# Patient Record
Sex: Female | Born: 1940 | Race: White | Hispanic: No | Marital: Married | Smoking: Never smoker
Health system: Southern US, Community
[De-identification: ages and names within clinical notes are randomized; demographics above are authoritative.]

## PROBLEM LIST (undated history)

## (undated) DIAGNOSIS — B009 Herpesviral infection, unspecified: Secondary | ICD-10-CM

## (undated) DIAGNOSIS — I10 Essential (primary) hypertension: Secondary | ICD-10-CM

## (undated) DIAGNOSIS — M8580 Other specified disorders of bone density and structure, unspecified site: Secondary | ICD-10-CM

## (undated) DIAGNOSIS — R059 Cough, unspecified: Secondary | ICD-10-CM

## (undated) DIAGNOSIS — F419 Anxiety disorder, unspecified: Secondary | ICD-10-CM

## (undated) DIAGNOSIS — K219 Gastro-esophageal reflux disease without esophagitis: Secondary | ICD-10-CM

## (undated) DIAGNOSIS — F32A Depression, unspecified: Secondary | ICD-10-CM

## (undated) DIAGNOSIS — K635 Polyp of colon: Secondary | ICD-10-CM

## (undated) DIAGNOSIS — K2270 Barrett's esophagus without dysplasia: Secondary | ICD-10-CM

## (undated) DIAGNOSIS — F329 Major depressive disorder, single episode, unspecified: Secondary | ICD-10-CM

## (undated) DIAGNOSIS — M549 Dorsalgia, unspecified: Secondary | ICD-10-CM

## (undated) DIAGNOSIS — R05 Cough: Secondary | ICD-10-CM

## (undated) DIAGNOSIS — E7800 Pure hypercholesterolemia, unspecified: Secondary | ICD-10-CM

## (undated) DIAGNOSIS — H9190 Unspecified hearing loss, unspecified ear: Secondary | ICD-10-CM

## (undated) HISTORY — DX: Gastro-esophageal reflux disease without esophagitis: K21.9

## (undated) HISTORY — DX: Cough, unspecified: R05.9

## (undated) HISTORY — PX: ABDOMINAL HYSTERECTOMY: SHX81

## (undated) HISTORY — DX: Barrett's esophagus without dysplasia: K22.70

## (undated) HISTORY — DX: Other specified disorders of bone density and structure, unspecified site: M85.80

## (undated) HISTORY — DX: Depression, unspecified: F32.A

## (undated) HISTORY — DX: Major depressive disorder, single episode, unspecified: F32.9

## (undated) HISTORY — PX: CHOLECYSTECTOMY: SHX55

## (undated) HISTORY — DX: Unspecified hearing loss, unspecified ear: H91.90

## (undated) HISTORY — PX: TUBAL LIGATION: SHX77

## (undated) HISTORY — DX: Anxiety disorder, unspecified: F41.9

## (undated) HISTORY — PX: CARPAL TUNNEL RELEASE: SHX101

## (undated) HISTORY — DX: Herpesviral infection, unspecified: B00.9

## (undated) HISTORY — DX: Cough: R05

## (undated) HISTORY — DX: Polyp of colon: K63.5

---

## 2009-11-21 HISTORY — PX: CARDIOVASCULAR STRESS TEST: SHX262

## 2012-05-18 LAB — HM MAMMOGRAPHY

## 2013-01-26 ENCOUNTER — Encounter: Payer: Self-pay | Admitting: *Deleted

## 2013-01-26 ENCOUNTER — Other Ambulatory Visit: Payer: Self-pay | Admitting: *Deleted

## 2013-01-27 MED ORDER — TIZANIDINE HCL 4 MG PO TABS: 4.0000 mg | ORAL_TABLET | Freq: Every evening | ORAL | Status: DC | PRN

## 2013-01-27 MED ORDER — OMEPRAZOLE 20 MG PO CPDR: 20.0000 mg | DELAYED_RELEASE_CAPSULE | Freq: Two times a day (BID) | ORAL | Status: DC

## 2013-01-27 MED ORDER — SERTRALINE HCL 50 MG PO TABS: ORAL_TABLET | ORAL | Status: DC

## 2013-03-17 ENCOUNTER — Encounter: Payer: Self-pay | Admitting: Family Medicine

## 2013-03-17 ENCOUNTER — Ambulatory Visit (INDEPENDENT_AMBULATORY_CARE_PROVIDER_SITE_OTHER): Payer: Medicare Other | Admitting: Family Medicine

## 2013-03-17 VITALS — BP 172/76 | HR 57 | Ht 64.5 in | Wt 162.0 lb

## 2013-03-17 DIAGNOSIS — K219 Gastro-esophageal reflux disease without esophagitis: Secondary | ICD-10-CM

## 2013-03-17 DIAGNOSIS — G4700 Insomnia, unspecified: Secondary | ICD-10-CM

## 2013-03-17 DIAGNOSIS — M79609 Pain in unspecified limb: Secondary | ICD-10-CM

## 2013-03-17 DIAGNOSIS — M79644 Pain in right finger(s): Secondary | ICD-10-CM

## 2013-03-17 DIAGNOSIS — F411 Generalized anxiety disorder: Secondary | ICD-10-CM

## 2013-03-17 MED ORDER — SERTRALINE HCL 50 MG PO TABS: ORAL_TABLET | ORAL | Status: DC

## 2013-03-17 MED ORDER — DICLOFENAC SODIUM 1 % TD GEL: TRANSDERMAL | Status: DC

## 2013-03-17 MED ORDER — TIZANIDINE HCL 4 MG PO TABS: 4.0000 mg | ORAL_TABLET | Freq: Every evening | ORAL | Status: DC | PRN

## 2013-03-17 MED ORDER — OMEPRAZOLE 20 MG PO CPDR: 20.0000 mg | DELAYED_RELEASE_CAPSULE | Freq: Two times a day (BID) | ORAL | Status: AC

## 2013-03-17 NOTE — Patient Instructions (Addendum)
1)  Hand Pain - Try the Voltaren Gel up to 4 times per day plus Tylenol 1000 mg up to 3 times vs Ibuprofen 600 mg + Tylenol 1000 mg.  Try the Fish Oil 1-3 teaspoons per day.   Osteoarthritis Osteoarthritis is the most common form of arthritis. It is redness, soreness, and swelling (inflammation) affecting the cartilage. Cartilage acts as a cushion, covering the ends of bones where they meet to form a joint. CAUSES  Over time, the cartilage begins to wear away. This causes bone to rub on bone. This produces pain and stiffness in the affected joints. Factors that contribute to this problem are:  Excessive body weight.  Age.  Overuse of joints. SYMPTOMS   People with osteoarthritis usually experience joint pain, swelling, or stiffness.  Over time, the joint may lose its normal shape.  Small deposits of bone (osteophytes) may grow on the edges of the joint.  Bits of bone or cartilage can break off and float inside the joint space. This may cause more pain and damage.  Osteoarthritis can lead to depression, anxiety, feelings of helplessness, and limitations on daily activities. The most commonly affected joints are in the:  Ends of the fingers.  Thumbs.  Neck.  Lower back.  Knees.  Hips. DIAGNOSIS  Diagnosis is mostly based on your symptoms and exam. Tests may be helpful, including:  X-rays of the affected joint.  A computerized magnetic scan (MRI).  Blood tests to rule out other types of arthritis.  Joint fluid tests. This involves using a needle to draw fluid from the joint and examining the fluid under a microscope. TREATMENT  Goals of treatment are to control pain, improve joint function, maintain a normal body weight, and maintain a healthy lifestyle. Treatment approaches may include:  A prescribed exercise program with rest and joint relief.  Weight control with nutritional education.  Pain relief techniques such as:  Properly applied heat and cold.  Electric  pulses delivered to nerve endings under the skin (transcutaneous electrical nerve stimulation, TENS).  Massage.  Certain supplements. Ask your caregiver before using any supplements, especially in combination with prescribed drugs.  Medicines to control pain, such as:  Acetaminophen.  Nonsteroidal anti-inflammatory drugs (NSAIDs), such as naproxen.  Narcotic or central-acting agents, such as tramadol. This drug carries a risk of addiction and is generally prescribed for short-term use.  Corticosteroids. These can be given orally or as injection. This is a short-term treatment, not recommended for routine use.  Surgery to reposition the bones and relieve pain (osteotomy) or to remove loose pieces of bone and cartilage. Joint replacement may be needed in advanced states of osteoarthritis. HOME CARE INSTRUCTIONS  Your caregiver can recommend specific types of exercise. These may include:  Strengthening exercises. These are done to strengthen the muscles that support joints affected by arthritis. They can be performed with weights or with exercise bands to add resistance.  Aerobic activities. These are exercises, such as brisk walking or low-impact aerobics, that get your heart pumping. They can help keep your lungs and circulatory system in shape.  Range-of-motion activities. These keep your joints limber.  Balance and agility exercises. These help you maintain daily living skills. Learning about your condition and being actively involved in your care will help improve the course of your osteoarthritis. SEEK MEDICAL CARE IF:   You feel hot or your skin turns red.  You develop a rash in addition to your joint pain.  You have an oral temperature above 102  F (38.9 C). FOR MORE INFORMATION  National Institute of Arthritis and Musculoskeletal and Skin Diseases: www.niams.http://www.myers.net/ [employer] on Aging: https://walker.com/ American College of Rheumatology:  www.rheumatology.org Document Released: 10/21/2005 Document Revised: 01/13/2012 Document Reviewed: 02/01/2010 [facility] Patient Information 2013 [place], [place].

## 2013-03-17 NOTE — Progress Notes (Signed)
  Subjective:    Patient ID: Lindsey Bender, female    DOB: 05-Sep-1941, 72 y.o.   MRN: [id number]  HPI Lindsey Bender is here today to discuss a few issues:    1)  Finger Pain:  She has been having pain and stiffness in her 4th Right Finger for the past 2 weeks.  She describes the pain as being severe and she feels that it is worsening.     2)  Anxiety:  She has done well with her Zoloft and needs a refill on it.    3)  GERD:  She continues to take her omeprazole twice daily and needs a refill on it.    4)  Insomnia:  She also needs to have her Zanaflex refilled.     Review of Systems  Constitutional: Negative for activity change, fatigue and unexpected weight change.  HENT: Negative.   Eyes: Negative.   Respiratory: Negative for shortness of breath.   Cardiovascular: Negative for chest pain, palpitations and leg swelling.  Gastrointestinal: Negative for diarrhea and constipation.  Endocrine: Negative.   Genitourinary: Negative for difficulty urinating.  Musculoskeletal: Positive for joint swelling and arthralgias.  Skin: Negative.   Neurological: Negative.   Hematological: Negative for adenopathy. Does not bruise/bleed easily.  Psychiatric/Behavioral: Negative for sleep disturbance and dysphoric mood. The patient is not nervous/anxious.        Objective:   Physical Exam  Constitutional: She appears well-nourished. No distress.  HENT:  Head: Normocephalic.  Eyes: No scleral icterus.  Neck: No thyromegaly present.  Cardiovascular: Normal rate, regular rhythm and normal heart sounds.   Pulmonary/Chest: Effort normal and breath sounds normal.  Abdominal: There is no tenderness.  Musculoskeletal: She exhibits no edema and no tenderness.       Right hand: She exhibits tenderness and swelling.  Neurological: She is alert.  Skin: Skin is warm and dry.  Psychiatric: She has a normal mood and affect. Her behavior is normal. Judgment and thought content normal.          Assessment &  Plan:

## 2013-03-29 ENCOUNTER — Encounter: Payer: Self-pay | Admitting: Family Medicine

## 2013-03-29 DIAGNOSIS — K219 Gastro-esophageal reflux disease without esophagitis: Secondary | ICD-10-CM | POA: Insufficient documentation

## 2013-03-29 DIAGNOSIS — G4700 Insomnia, unspecified: Secondary | ICD-10-CM | POA: Insufficient documentation

## 2013-03-29 DIAGNOSIS — M79644 Pain in right finger(s): Secondary | ICD-10-CM | POA: Insufficient documentation

## 2013-03-29 DIAGNOSIS — F411 Generalized anxiety disorder: Secondary | ICD-10-CM | POA: Insufficient documentation

## 2013-03-29 NOTE — Assessment & Plan Note (Signed)
Refilled her Zoloft  

## 2013-03-29 NOTE — Assessment & Plan Note (Signed)
Refilled her Zanaflex.

## 2013-03-29 NOTE — Assessment & Plan Note (Signed)
Refilled her omeprazole.  

## 2013-03-29 NOTE — Assessment & Plan Note (Signed)
She was given a prescription for Voltaren Gel and is to take some fish oil to help with inflammation.

## 2013-04-21 ENCOUNTER — Ambulatory Visit (INDEPENDENT_AMBULATORY_CARE_PROVIDER_SITE_OTHER): Payer: Medicare Other | Admitting: Family Medicine

## 2013-04-21 ENCOUNTER — Encounter: Payer: Self-pay | Admitting: Family Medicine

## 2013-04-21 VITALS — BP 122/70 | HR 60 | Ht 64.5 in | Wt 164.0 lb

## 2013-04-21 DIAGNOSIS — I10 Essential (primary) hypertension: Secondary | ICD-10-CM

## 2013-04-21 DIAGNOSIS — M7989 Other specified soft tissue disorders: Secondary | ICD-10-CM

## 2013-04-21 MED ORDER — FUROSEMIDE 20 MG PO TABS: ORAL_TABLET | ORAL | Status: DC

## 2013-04-21 NOTE — Assessment & Plan Note (Signed)
We discussed reasons why she might be having some edema.  The only thing that is different is that she has been using something called Blue Emu.  She is to hold on this and will watch her intake of sodium.  If her swelling does not improve, we may send for a venous doppler study.  She was given a prescription for Lasix.

## 2013-04-21 NOTE — Assessment & Plan Note (Signed)
Her BP is much better than it was at her last visit.

## 2013-04-21 NOTE — Patient Instructions (Addendum)
1)  Edema - Drink a lot of water with some lemon juice.  Decrease the EMU cream and see if the swelling improves.  If needed, you can try some of the Lasix.  Exercise and limit intake of sodium.    Edema Edema is an abnormal build-up of fluids in tissues. Because this is partly dependent on gravity (water flows to the lowest place), it is more common in the legs and thighs (lower extremities). It is also common in the looser tissues, like around the eyes. Painless swelling of the feet and ankles is common and increases as a person ages. It may affect both legs and may include the calves or even thighs. When squeezed, the fluid may move out of the affected area and may leave a dent for a few moments. CAUSES   Prolonged standing or sitting in one place for extended periods of time. Movement helps pump tissue fluid into the veins, and absence of movement prevents this, resulting in edema.  Varicose veins. The valves in the veins do not work as well as they should. This causes fluid to leak into the tissues.  Fluid and salt overload.  Injury, burn, or surgery to the leg, ankle, or foot, may damage veins and allow fluid to leak out.  Sunburn damages vessels. Leaky vessels allow fluid to go out into the sunburned tissues.  Allergies (from insect bites or stings, medications or chemicals) cause swelling by allowing vessels to become leaky.  Protein in the blood helps keep fluid in your vessels. Low protein, as in malnutrition, allows fluid to leak out.  Hormonal changes, including pregnancy and menstruation, cause fluid retention. This fluid may leak out of vessels and cause edema.  Medications that cause fluid retention. Examples are sex hormones, blood pressure medications, steroid treatment, or anti-depressants.  Some illnesses cause edema, especially heart failure, kidney disease, or liver disease.  Surgery that cuts veins or lymph nodes, such as surgery done for the heart or for breast  cancer, may result in edema. DIAGNOSIS  Your caregiver is usually easily able to determine what is causing your swelling (edema) by simply asking what is wrong (getting a history) and examining you (doing a physical). Sometimes x-rays, EKG (electrocardiogram or heart tracing), and blood work may be done to evaluate for underlying medical illness. TREATMENT  General treatment includes:  Leg elevation (or elevation of the affected body part).  Restriction of fluid intake.  Prevention of fluid overload.  Compression of the affected body part. Compression with elastic bandages or support stockings squeezes the tissues, preventing fluid from entering and forcing it back into the blood vessels.  Diuretics (also called water pills or fluid pills) pull fluid out of your body in the form of increased urination. These are effective in reducing the swelling, but can have side effects and must be used only under your caregiver's supervision. Diuretics are appropriate only for some types of edema. The specific treatment can be directed at any underlying causes discovered. Heart, liver, or kidney disease should be treated appropriately. HOME CARE INSTRUCTIONS   Elevate the legs (or affected body part) above the level of the heart, while lying down.  Avoid sitting or standing still for prolonged periods of time.  Avoid putting anything directly under the knees when lying down, and do not wear constricting clothing or garters on the upper legs.  Exercising the legs causes the fluid to work back into the veins and lymphatic channels. This may help the swelling go down.  The pressure applied by elastic bandages or support stockings can help reduce ankle swelling.  A low-salt diet may help reduce fluid retention and decrease the ankle swelling.  Take any medications exactly as prescribed. SEEK MEDICAL CARE IF:  Your edema is not responding to recommended treatments. SEEK IMMEDIATE MEDICAL CARE IF:    You develop shortness of breath or chest pain.  You cannot breathe when you lay down; or if, while lying down, you have to get up and go to the window to get your breath.  You are having increasing swelling without relief from treatment.  You develop a fever over 102 F (38.9 C).  You develop pain or redness in the areas that are swollen.  Tell your caregiver right away if you have gained 3 lb/1.4 kg in 1 day or 5 lb/2.3 kg in a week. MAKE SURE YOU:   Understand these instructions.  Will watch your condition.  Will get help right away if you are not doing well or get worse. Document Released: 10/21/2005 Document Revised: 04/21/2012 Document Reviewed: 06/08/2008 [facility] Patient Information 2014 [place], [place].

## 2013-04-21 NOTE — Progress Notes (Signed)
  Subjective:    Patient ID: Lindsey Bender, female    DOB: 1940/12/03, 72 y.o.   MRN: [id number]  HPI  Lindsey Bender is here today complaining of swelling in both her feet.   She noticed this problem stared about three weeks ago. This problem seems to be the same throughout the day.    Review of Systems  Respiratory: Negative for shortness of breath.   Musculoskeletal: Negative for myalgias and arthralgias.       Bilateral feet swelling  Skin: Negative for rash.    Past Medical History  Diagnosis Date  . GERD (gastroesophageal reflux disease)   . Anxiety   . Depression   . Osteopenia   . Colon polyp   . Hearing loss     Hearing Aides x's 2  . Cough   . 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 Heart disease Father     CVD (bypass x's 4)  . Alzheimer's disease Father   . Heart disease Mother 34    Rheumatic heart disease  . Heart attack Brother     MI x's 3--Stents  . Heart disease Paternal Uncle    History   Social History Narrative   Marital Status: Married Lindsey Bender) x 53 years   Children: 3    Pets:  Dogs (2)    Living Situation: Lives with husband   Occupation: Retired Agricultural consultant)    Education: 9 th grade   Tobacco Use/Exposure:  None    Alcohol Use:  None   Drug Use:  None   Diet:  Regular   Exercise:  None   Hobbies: Reading        Objective:   Physical Exam  Constitutional: She appears well-nourished. No distress.  Cardiovascular: Normal rate, regular rhythm and normal heart sounds.   Pulmonary/Chest: Effort normal and breath sounds normal. No respiratory distress.  Musculoskeletal: She exhibits edema (Mild edema ).  Skin: No erythema.          Assessment & Plan:

## 2013-06-18 LAB — HM MAMMOGRAPHY

## 2013-09-23 ENCOUNTER — Other Ambulatory Visit: Payer: Self-pay | Admitting: *Deleted

## 2013-09-23 DIAGNOSIS — R5381 Other malaise: Secondary | ICD-10-CM

## 2013-09-23 DIAGNOSIS — E785 Hyperlipidemia, unspecified: Secondary | ICD-10-CM

## 2013-09-24 ENCOUNTER — Other Ambulatory Visit: Payer: Medicare Other

## 2013-09-24 LAB — COMPLETE METABOLIC PANEL WITH GFR
ALT: 17 U/L (ref 0–35)
AST: 20 U/L (ref 0–37)
Albumin: 3.8 g/dL (ref 3.5–5.2)
Alkaline Phosphatase: 81 U/L (ref 39–117)
BUN: 13 mg/dL (ref 6–23)
CO2: 29 mEq/L (ref 19–32)
Calcium: 9.1 mg/dL (ref 8.4–10.5)
Chloride: 102 mEq/L (ref 96–112)
Creat: 0.74 mg/dL (ref 0.50–1.10)
GFR, Est African American: >89 mL/min
GFR, Est Non African American: 82 mL/min
Glucose, Bld: 88 mg/dL (ref 70–99)
Potassium: 4.5 mEq/L (ref 3.5–5.3)
Sodium: 140 mEq/L (ref 135–145)
Total Bilirubin: 0.6 mg/dL (ref 0.3–1.2)
Total Protein: 6.4 g/dL (ref 6.0–8.3)

## 2013-09-24 LAB — CBC WITH DIFFERENTIAL/PLATELET
Basophils Absolute: 0 10*3/uL (ref 0.0–0.1)
Basophils Relative: 1 % (ref 0–1)
Eosinophils Absolute: 0.1 10*3/uL (ref 0.0–0.7)
Eosinophils Relative: 2 % (ref 0–5)
HCT: 39.2 % (ref 36.0–46.0)
Hemoglobin: 13.6 g/dL (ref 12.0–15.0)
Lymphocytes Relative: 32 % (ref 12–46)
Lymphs Abs: 1.9 10*3/uL (ref 0.7–4.0)
MCH: 31.9 pg (ref 26.0–34.0)
MCHC: 34.7 g/dL (ref 30.0–36.0)
MCV: 91.8 fL (ref 78.0–100.0)
Monocytes Absolute: 0.6 10*3/uL (ref 0.1–1.0)
Monocytes Relative: 10 % (ref 3–12)
Neutro Abs: 3.2 10*3/uL (ref 1.7–7.7)
Neutrophils Relative %: 55 % (ref 43–77)
Platelets: 326 10*3/uL (ref 150–400)
RBC: 4.27 MIL/uL (ref 3.87–5.11)
RDW: 14.4 % (ref 11.5–15.5)
WBC: 5.8 10*3/uL (ref 4.0–10.5)

## 2013-09-24 LAB — TSH: TSH: 3.213 u[IU]/mL (ref 0.350–4.500)

## 2013-09-24 LAB — LIPID PANEL
Cholesterol: 238 mg/dL — ABNORMAL HIGH (ref 0–200)
HDL: 66 mg/dL (ref >39)
LDL Cholesterol: 158 mg/dL — ABNORMAL HIGH (ref 0–99)
Total CHOL/HDL Ratio: 3.6 Ratio
Triglycerides: 69 mg/dL (ref <150)
VLDL: 14 mg/dL (ref 0–40)

## 2013-09-27 ENCOUNTER — Ambulatory Visit (INDEPENDENT_AMBULATORY_CARE_PROVIDER_SITE_OTHER): Payer: Medicare Other | Admitting: Family Medicine

## 2013-09-27 ENCOUNTER — Encounter: Payer: Self-pay | Admitting: Family Medicine

## 2013-09-27 VITALS — BP 133/78 | HR 58 | Resp 16 | Wt 158.0 lb

## 2013-09-27 DIAGNOSIS — F329 Major depressive disorder, single episode, unspecified: Secondary | ICD-10-CM | POA: Insufficient documentation

## 2013-09-27 DIAGNOSIS — K219 Gastro-esophageal reflux disease without esophagitis: Secondary | ICD-10-CM

## 2013-09-27 DIAGNOSIS — IMO0001 Reserved for inherently not codable concepts without codable children: Secondary | ICD-10-CM | POA: Insufficient documentation

## 2013-09-27 DIAGNOSIS — F411 Generalized anxiety disorder: Secondary | ICD-10-CM

## 2013-09-27 DIAGNOSIS — G4700 Insomnia, unspecified: Secondary | ICD-10-CM

## 2013-09-27 DIAGNOSIS — F3289 Other specified depressive episodes: Secondary | ICD-10-CM | POA: Insufficient documentation

## 2013-09-27 MED ORDER — SERTRALINE HCL 50 MG PO TABS: 50.0000 mg | ORAL_TABLET | Freq: Every day | ORAL | Status: DC

## 2013-09-27 MED ORDER — TIZANIDINE HCL 4 MG PO TABS: 4.0000 mg | ORAL_TABLET | Freq: Every day | ORAL | Status: DC

## 2013-09-27 NOTE — Assessment & Plan Note (Signed)
Refilled her Tizanidine.

## 2013-09-27 NOTE — Assessment & Plan Note (Signed)
Refilled her Zoloft  

## 2013-09-27 NOTE — Progress Notes (Signed)
Subjective:    Patient ID: Lindsey Bender, female    DOB: 01-Mar-1941, 72 y.o.   MRN: [id number]  HPI  Lindsey Bender is here today to go over her most recent lab results and to get her medications refilled.   1)  Mood:  She is taking her Zoloft (50 mg daily).  She used to take only 25 mg but this has been a stressful year and she feels that the 50 mg has helped her cope better with the stress.      2)  GERD:  She takes Omeprazole 20 mg (twice daily).  This dosage was increased in 12/2010 after her last EGD showed that she had Barrett's Esophagus.  She had this EGD with Dr. Claudine Mouton.  She thought she was to have a repeat in one year but that was not set up.  She would like to be referred to a gastroenterologist to follow this condition.  She is also going to be due for her colonoscopy in 04-12-14.  Her last one was in 04/12/2009 and she has a history of polyps.    3)  Sleep Disturbance:  She needs her tizanidine refilled.    Review of Systems  Constitutional: Negative.   HENT: Negative.   Eyes: Negative.   Respiratory: Negative.   Cardiovascular: Negative.   Gastrointestinal: Negative.   Endocrine: Negative.   Genitourinary: Negative.   Musculoskeletal: Negative.   Skin: Negative.   Allergic/Immunologic: Negative.   Neurological: Negative.   Hematological: Negative.   Psychiatric/Behavioral: Positive for dysphoric mood. The patient is nervous/anxious.        Increased stress in 2013/04/12 (Death of Father, Moving, Work)      Past Medical History  Diagnosis Date  . GERD (gastroesophageal reflux disease)   . Anxiety   . Depression   . Osteopenia   . Colon polyp   . Hearing loss     Hearing Aids x 2  . Cough   . 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 Cholecystectomy    . Tubal ligation    . Abdominal hysterectomy      Fibroid Tumors  . Cardiovascular stress test  11/21/09    it was done due to an abnormal EKG. Her stress test was WNL      Family History  Problem Relation Age of Onset  . Heart disease Father     CVD (bypass x's 4)  . Alzheimer's disease Father   . Heart disease Mother 7    Rheumatic heart disease  . Heart attack Brother     MI x's 3--Stents  . Heart disease Paternal Uncle      History   Social History Narrative   Marital Status: Married Merlyn Albert) x 53 years   Children: 3    Pets:  Dogs (2)    Living Situation: Lives with husband   Occupation: Retired Agricultural consultant)    Education: 9 th grade   Tobacco Use/Exposure:  None    Alcohol Use:  None   Drug Use:  None   Diet:  Regular   Exercise:  None   Hobbies: Reading       Objective:   Physical Exam  Constitutional: She appears well-nourished. No distress.  HENT:  Head: Normocephalic.  Eyes: No scleral icterus.  Neck: No thyromegaly present.  Cardiovascular: Normal rate, regular rhythm and normal heart sounds.   Pulmonary/Chest: Effort normal and breath sounds normal.  Abdominal: There is no  tenderness.  Musculoskeletal: She exhibits no edema and no tenderness.  Neurological: She is alert.  Skin: Skin is warm and dry.  Psychiatric: She has a normal mood and affect. Her behavior is normal. Judgment and thought content normal.          Assessment & Plan:

## 2013-09-27 NOTE — Assessment & Plan Note (Signed)
She was noted to have Barrett's Esophagus at her EGD in 2012.  She is in need of another one.  We'll refer her to a gastroenterologist.

## 2013-09-27 NOTE — Patient Instructions (Addendum)
1)   Mood - Continue on the 50 mg of Zoloft through the holidays before you consider cutting back to 25 mg.  YOGA might help with mood/anxiety if you feel that you need more help and don't want to increase your medication.   2)  GI - You are past due for an EGD and you'll need a colonoscopy next year so we're getting you set up with a gastroenterologist.  Stay on the omeprazole 2 x per day until you see the gastroenterologist and get their opinion about what you should be on.    3)  Insomnia - Continue on the tizanidine at bedtime.                              Barrett's Esophagus Barrett's esophagus occurs when the lining of the esophagus is damaged. The esophagus is the tube that carries food from the mouth to the stomach. With Barrett's esophagus, the lining of the esophagus gets replaced by material that is similar to the lining in the intestines. This process is called intestinal metaplasia. A small number of people with Barrett's esophagus develop esophageal cancer. CAUSES  The exact cause of Barrett's esophagus is unknown. SYMPTOMS  Most people with Barrett's esophagus do not have symptoms. However, many patients also have gastroesophageal reflux disease (GERD). GERD can cause heartburn, trouble swallowing, and a dry cough. DIAGNOSIS Barrett's esophagus is diagnosed by an exam called upper gastrointestinal endoscopy. A thin, flexible tube (endoscope) is passed down the esophagus. The endoscope has a light and camera on the end. Your caregiver uses the endoscope to view the inside of the esophagus. A tissue sample may also be taken and examined under a microscope (biopsy). If cancer cells are found during the biopsy, this condition is called dysplasia. TREATMENT  If you have no dysplasia or low-grade dysplasia, your caregiver may recommend no treatment or only taking medicines to treat GERD. Sometimes, taking acid-blocking drugs to treat GERD helps improve the tissue affected by Barrett's  esophagus. Your caregiver may also recommend periodic esophageal exams. If you have high-grade dysplasia, treatment may include removing the damaged parts of the esophagus. This can be done by heating, freezing, or surgically removing the tissue. In some cases, surgery may be done to remove most of the esophagus. The stomach is then attached to the remaining portion of the esophagus. HOME CARE INSTRUCTIONS  Take acid-blocking drugs for GERD if recommended by your caregiver.  Keep all follow-up appointments as directed by your caregiver. You may need periodic esophageal exams. SEEK IMMEDIATE MEDICAL CARE IF:  You have chest pain.  You have trouble swallowing.  You vomit blood or material that looks like coffee grounds.  Your stools are bright red or dark. Document Released: 01/11/2004 Document Revised: 04/21/2012 Document Reviewed: 12/31/2011 [facility] Patient Information 2014 [place], [place].

## 2013-10-07 ENCOUNTER — Encounter: Payer: Self-pay | Admitting: *Deleted

## 2013-10-20 ENCOUNTER — Telehealth: Payer: Self-pay | Admitting: *Deleted

## 2013-10-20 NOTE — Telephone Encounter (Signed)
She is aware of her appointment with Dr Norma Fredrickson on 11/30/12 @ 8:30 at the [facility] office.

## 2013-11-04 DIAGNOSIS — K2270 Barrett's esophagus without dysplasia: Secondary | ICD-10-CM

## 2013-11-04 HISTORY — DX: Barrett's esophagus without dysplasia: K22.70

## 2014-01-10 ENCOUNTER — Ambulatory Visit (INDEPENDENT_AMBULATORY_CARE_PROVIDER_SITE_OTHER): Payer: 59 | Admitting: Family Medicine

## 2014-01-10 ENCOUNTER — Encounter: Payer: Self-pay | Admitting: Family Medicine

## 2014-01-10 ENCOUNTER — Encounter (INDEPENDENT_AMBULATORY_CARE_PROVIDER_SITE_OTHER): Payer: Self-pay

## 2014-01-10 VITALS — BP 146/70 | HR 78 | Resp 16 | Ht 64.5 in | Wt 160.0 lb

## 2014-01-10 DIAGNOSIS — B009 Herpesviral infection, unspecified: Secondary | ICD-10-CM

## 2014-01-10 DIAGNOSIS — IMO0001 Reserved for inherently not codable concepts without codable children: Secondary | ICD-10-CM

## 2014-01-10 DIAGNOSIS — Z23 Encounter for immunization: Secondary | ICD-10-CM

## 2014-01-10 DIAGNOSIS — F329 Major depressive disorder, single episode, unspecified: Secondary | ICD-10-CM

## 2014-01-10 DIAGNOSIS — G4700 Insomnia, unspecified: Secondary | ICD-10-CM

## 2014-01-10 DIAGNOSIS — F3289 Other specified depressive episodes: Secondary | ICD-10-CM

## 2014-01-10 DIAGNOSIS — Z0000 Encounter for general adult medical examination without abnormal findings: Secondary | ICD-10-CM

## 2014-01-10 LAB — POCT URINALYSIS DIPSTICK
Bilirubin, UA: NEGATIVE
Blood, UA: NEGATIVE
Glucose, UA: NEGATIVE
Ketones, UA: NEGATIVE
Leukocytes, UA: NEGATIVE
Nitrite, UA: NEGATIVE
Protein, UA: NEGATIVE
Spec Grav, UA: 1.015
Urobilinogen, UA: NEGATIVE
pH, UA: 6

## 2014-01-10 MED ORDER — TIZANIDINE HCL 4 MG PO TABS: 4.0000 mg | ORAL_TABLET | Freq: Every day | ORAL | Status: AC

## 2014-01-10 MED ORDER — FAMCICLOVIR 500 MG PO TABS: ORAL_TABLET | ORAL | Status: AC

## 2014-01-10 MED ORDER — SERTRALINE HCL 50 MG PO TABS: 50.0000 mg | ORAL_TABLET | Freq: Every day | ORAL | Status: AC

## 2014-01-10 NOTE — Progress Notes (Signed)
Subjective:    Patient ID: Lindsey Bender, female    DOB: 09/19/1941, 73 y.o.   MRN: [id number]  HPI  Lindsey Bender is here today for her annual CPE.  She has done well since her last office visit.  She needs her Zoloft and Tizanidine refilled.    Review of Systems  Constitutional: Negative for activity change, appetite change, fatigue and unexpected weight change.  HENT: Negative for congestion, dental problem, ear pain, hearing loss, trouble swallowing and voice change.   Eyes: Negative for pain, redness and visual disturbance.  Respiratory: Negative for cough and shortness of breath.   Cardiovascular: Negative for chest pain, palpitations and leg swelling.  Gastrointestinal: Negative for nausea, vomiting, abdominal pain, diarrhea, constipation and blood in stool.  Endocrine: Negative for cold intolerance, heat intolerance, polydipsia, polyphagia and polyuria.  Genitourinary: Negative for dysuria, urgency, frequency, hematuria, vaginal discharge and pelvic pain.  Musculoskeletal: Negative for arthralgias, back pain, joint swelling, myalgias and neck pain.  Skin: Negative for rash.  Neurological: Negative for dizziness, weakness and headaches.  Hematological: Negative for adenopathy. Does not bruise/bleed easily.  Psychiatric/Behavioral: Negative for sleep disturbance, dysphoric mood and decreased concentration. The patient is not nervous/anxious.     Past Medical History  Diagnosis Date  . GERD (gastroesophageal reflux disease)   . Anxiety   . Depression   . Osteopenia   . Colon polyp   . Hearing loss     Hearing Aids x 2  . Cough   . 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 Barrett esophagus 11/2013    EGD (Dr. Norma Fredricksonoledo) Repeat in 3 years.     Past Surgical History  Procedure Laterality Date  . Cholecystectomy    . Tubal ligation    . Abdominal hysterectomy      Fibroid Tumors  . Cardiovascular stress test  11/21/09    it was done due to an abnormal EKG. 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 Heart disease Father     CVD (bypass x's 4)  . Alzheimer's disease Father   . Heart disease Mother [id number]    Rheumatic heart disease  . Heart attack Brother     MI x's 3--Stents  . Heart disease Paternal Uncle      Current Outpatient Prescriptions on File Prior to Visit  Medication Sig Dispense Refill  . Calcium Citrate-Vitamin D (CITRACAL + D PO) Take 2 tablets by mouth.      Marland Kitchen. omeprazole (PRILOSEC) 20 MG capsule Take 1 capsule (20 mg total) by mouth 2 (two) times daily.  60 capsule  11   No current facility-administered medications on file prior to visit.     Allergies  Allergen Reactions  . Codeine Nausea And Vomiting  . Tetracyclines & Related Nausea And Vomiting     Immunization History  Administered Date(s) Administered  . Pneumococcal Conjugate-13 01/10/2014  . Td 03/01/2011  . Zoster 11/21/2011  Objective:   Physical Exam  Vitals reviewed. Constitutional: She is oriented to person, place, and time. She appears well-developed and well-nourished.  HENT:  Head: Normocephalic and atraumatic.  Right Ear: External ear normal.  Left Ear: External ear normal.  Nose: Nose normal.  Mouth/Throat: Oropharynx is clear and moist.  Eyes: Conjunctivae and EOM are normal. Pupils are equal, round, and reactive to light.  Neck: Normal range of motion. No thyromegaly present.  Cardiovascular: Normal rate, regular rhythm, normal heart sounds and intact distal pulses.  Exam reveals no gallop and no friction rub.   No murmur heard. Pulmonary/Chest: Effort normal  and breath sounds normal. Right breast exhibits no inverted nipple, no mass, no nipple discharge, no skin change and no tenderness. Left breast exhibits no inverted nipple, no mass, no nipple discharge, no skin change and no tenderness. Breasts are symmetrical.  Abdominal: Soft. Bowel sounds are normal. Hernia confirmed negative in the right inguinal area and confirmed negative in the left inguinal area.  Genitourinary: Rectum normal and vagina normal. Pelvic exam was performed with patient supine. There is no rash, tenderness or lesion on the right labia. There is no rash, tenderness or lesion on the left labia. No vaginal discharge found.  Musculoskeletal: Normal range of motion. She exhibits no edema and no tenderness.  Lymphadenopathy:    She has no cervical adenopathy.       Right: No inguinal adenopathy present.       Left: No inguinal adenopathy present.  Neurological: She is alert and oriented to person, place, and time. She has normal reflexes.  Skin: Skin is warm and dry.  Psychiatric: She has a normal mood and affect. Her behavior is normal. Judgment and thought content normal.      Assessment & Plan:  Lindsey Bender was seen today for annual exam.  Diagnoses and associated orders for this visit:  Routine general medical examination at a health care facility The patient had a normal CPE.  We addressed preventative issues appropriate for her age.  Her U/A and EKG were WNL.  - POCT urinalysis dipstick - EKG 12-Lead  Insomnia - tiZANidine (ZANAFLEX) 4 MG tablet; Take 1 tablet (4 mg total) by mouth at bedtime.  Myalgia and myositis - tiZANidine (ZANAFLEX) 4 MG tablet; Take 1 tablet (4 mg total) by mouth at bedtime.  Depressive disorder, not elsewhere classified - sertraline (ZOLOFT) 50 MG tablet; Take 1 tablet (50 mg total) by mouth daily.  HSV-1 infection - famciclovir (FAMVIR) 500 MG tablet; Take 3 tabs po at onset of symptoms x 1 day  Need for prophylactic vaccination against  Streptococcus pneumoniae (pneumococcus) and influenza She received her Prevnar-13 without difficulty.  She was given a handout discussing possible side  effects.  - Pneumococcal conjugate vaccine 13-valent   TIME SPENT "FACE TO FACE" WITH PATIENT -  45 MINS

## 2014-01-11 ENCOUNTER — Encounter: Payer: Self-pay | Admitting: *Deleted

## 2014-01-14 ENCOUNTER — Other Ambulatory Visit (INDEPENDENT_AMBULATORY_CARE_PROVIDER_SITE_OTHER): Payer: 59 | Admitting: *Deleted

## 2014-01-14 DIAGNOSIS — Z1210 Encounter for screening for malignant neoplasm of intestinal tract, unspecified: Secondary | ICD-10-CM

## 2014-01-14 LAB — HEMOCCULT GUIAC POC 1CARD (OFFICE)
Card #2 Fecal Occult Blod, POC: NEGATIVE
Card #3 Fecal Occult Blood, POC: NEGATIVE
Fecal Occult Blood, POC: NEGATIVE

## 2014-03-19 DIAGNOSIS — Z0000 Encounter for general adult medical examination without abnormal findings: Secondary | ICD-10-CM | POA: Insufficient documentation

## 2014-03-19 DIAGNOSIS — B009 Herpesviral infection, unspecified: Secondary | ICD-10-CM | POA: Insufficient documentation

## 2014-03-19 DIAGNOSIS — Z23 Encounter for immunization: Secondary | ICD-10-CM | POA: Insufficient documentation

## 2014-04-11 ENCOUNTER — Encounter: Payer: Self-pay | Admitting: Family Medicine

## 2014-04-11 ENCOUNTER — Ambulatory Visit (INDEPENDENT_AMBULATORY_CARE_PROVIDER_SITE_OTHER): Payer: 59 | Admitting: Family Medicine

## 2014-04-11 VITALS — BP 147/66 | HR 65 | Resp 16 | Ht 64.5 in | Wt 159.0 lb

## 2014-04-11 DIAGNOSIS — R6884 Jaw pain: Secondary | ICD-10-CM

## 2014-04-11 MED ORDER — CELECOXIB 200 MG PO CAPS: 200.0000 mg | ORAL_CAPSULE | Freq: Every day | ORAL | Status: AC

## 2014-04-11 MED ORDER — DICLOFENAC SODIUM 1 % TD GEL: 4.0000 g | Freq: Four times a day (QID) | TRANSDERMAL | Status: AC

## 2014-04-11 MED ORDER — KETOROLAC TROMETHAMINE 60 MG/2ML IM SOLN
60.0000 mg | Freq: Once | INTRAMUSCULAR | Status: AC
  Administered 2014-04-11: 60 mg via INTRAMUSCULAR

## 2014-04-11 MED ORDER — METHYLPREDNISOLONE SODIUM SUCC 125 MG IJ SOLR
125.0000 mg | Freq: Once | INTRAMUSCULAR | Status: AC
  Administered 2014-04-11: 125 mg via INTRAMUSCULAR

## 2014-04-11 NOTE — Patient Instructions (Addendum)
1                                                                                                  Temporomandibular Problems  Temporomandibular joint (TMJ) dysfunction means there are problems with the joint between your jaw and your skull. This is a joint lined by cartilage like other joints in your body but also has a small disc in the joint which keeps the bones from rubbing on each other. These joints are like other joints and can get inflamed (sore) from arthritis and other problems. When this joint gets sore, it can cause headaches and pain in the jaw and the face. CAUSES  Usually the arthritic types of problems are caused by soreness in the joint. Soreness in the joint can also be caused by overuse. This may come from grinding your teeth. It may also come from mis-alignment in the joint. DIAGNOSIS Diagnosis of this condition can often be made by history and exam. Sometimes your caregiver may need X-rays or an MRI scan to determine the exact cause. It may be necessary to see your dentist to determine if your teeth and jaws are lined up correctly. TREATMENT  Most of the time this problem is not serious; however, sometimes it can persist (become chronic). When this happens medications that will cut down on inflammation (soreness) help. Sometimes a shot of cortisone into the joint will be helpful. If your teeth are not aligned it may help for your dentist to make a splint for your mouth that can help this problem. If no physical problems can be found, the problem may come from tension. If tension is found to be the cause, biofeedback or relaxation techniques may be helpful. HOME CARE INSTRUCTIONS   Later in the day, applications of ice packs may be helpful. Ice can be used in a plastic bag with a towel around it to prevent frostbite to skin. This may be used about every 2 hours for 20 to 30 minutes, as needed while awake, or as directed by your caregiver.  Only take over-the-counter or  prescription medicines for pain, discomfort, or fever as directed by your caregiver.  If physical therapy was prescribed, follow your caregiver's directions.  Wear mouth appliances as directed if they were given. Document Released: 07/16/2001 Document Revised: 01/13/2012 Document Reviewed: 10/23/2008 [facility] Patient Information 2014 [place], [place].

## 2014-04-11 NOTE — Progress Notes (Signed)
Subjective:    Patient ID: Lindsey Bender, female    DOB: 21-Apr-1941, 73 y.o.   MRN: [id number]  HPI  Lindsey Bender is here today complaining of right jaw pain.  It is in the area of a knot that she has had for many years.  She had this knot biopsied years ago at [facility] and it was benign. She has taken ibuprofen for the pain which helped a little.    Review of Systems  HENT: Negative for sore throat, trouble swallowing and voice change.        Painful to chew food  Cardiovascular: Negative for chest pain, palpitations and leg swelling.  Musculoskeletal:       Pain on the right side of her jaw  All other systems reviewed and are negative.    Past Medical History  Diagnosis Date  . GERD (gastroesophageal reflux disease)   . Anxiety   . Depression   . Osteopenia   . Colon polyp   . Hearing loss     Hearing Aids x 2  . Cough   . 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 Barrett esophagus 11/2013    EGD (Dr. Norma Fredrickson) Repeat in 3 years.     Past Surgical History  Procedure Laterality Date  . Cholecystectomy    . Tubal ligation    . Abdominal hysterectomy      Fibroid Tumors  . Cardiovascular stress test  11/21/09    it was done due to an abnormal EKG. Her stress test was WNL     History   Social History Narrative   Marital Status: Married (Fred) x 53 years   Children: 3    Pets:  Dogs (2)    Living Situation: Lives with husband   Occupation: Retired Agricultural consultant)    Education: 9 th grade   Tobacco Use/Exposure:  None    Alcohol Use:  None   Drug Use:  None   Diet:  Regular   Exercise:  None   Hobbies: Reading     Family History  Problem Relation Age of Onset  . Heart disease Father     CVD (bypass x's 4)  . Alzheimer's disease Father   . Heart disease Mother 51    Rheumatic heart disease  . Heart attack Brother     MI x's 3--Stents  . Heart disease Paternal Uncle      Current Outpatient Prescriptions on File Prior to Visit  Medication Sig  Dispense Refill  . Calcium Citrate-Vitamin D (CITRACAL + D PO) Take 2 tablets by mouth.      . famciclovir (FAMVIR) 500 MG tablet Take 3 tabs po at onset of symptoms x 1 day  30 tablet  1  . omeprazole (PRILOSEC) 20 MG capsule Take 1 capsule (20 mg total) by mouth 2 (two) times daily.  60 capsule  11  . sertraline (ZOLOFT) 50 MG tablet Take 1 tablet (50 mg total) by mouth daily.  90 tablet  1  . tiZANidine (ZANAFLEX) 4 MG tablet Take 1 tablet (4 mg total) by mouth at bedtime.  90 tablet  1   No current facility-administered medications on file prior to visit.     Allergies  Allergen Reactions  . Codeine Nausea And Vomiting  . Tetracyclines & Related Nausea And Vomiting     Immunization History  Administered Date(s) Administered  . Pneumococcal Conjugate-13 01/10/2014  . Td 03/01/2011  . Zoster 11/21/2011       Objective:  Physical Exam  Constitutional: She appears well-nourished. No distress.  HENT:  Head: Normocephalic.  Knot on the right side of jaw  Eyes: No scleral icterus.  Neck: No thyromegaly present.  Cardiovascular: Normal rate, regular rhythm and normal heart sounds.   Pulmonary/Chest: Effort normal and breath sounds normal.  Abdominal: There is no tenderness.  Musculoskeletal: She exhibits tenderness (Right Jaw). She exhibits no edema.  Neurological: She is alert.  Skin: Skin is warm and dry.  Psychiatric: She has a normal mood and affect. Her behavior is normal. Judgment and thought content normal.      Assessment & Plan:    Lindsey Bender was seen today for jaw pain.  Diagnoses and associated orders for this visit:  Jaw pain - celecoxib (CELEBREX) 200 MG capsule; Take 1 capsule (200 mg total) by mouth daily. - diclofenac sodium (VOLTAREN) 1 % GEL; Apply 4 g topically 4 (four) times daily. - methylPREDNISolone sodium succinate (SOLU-MEDROL) 125 mg/2 mL injection 125 mg; Inject 2 mLs (125 mg total) into the muscle once. - ketorolac (TORADOL) injection 60 mg;  Inject 2 mLs (60 mg total) into the muscle once.

## 2014-06-29 ENCOUNTER — Other Ambulatory Visit: Payer: Self-pay | Admitting: *Deleted

## 2014-06-29 ENCOUNTER — Telehealth: Payer: Self-pay

## 2014-06-29 DIAGNOSIS — R928 Other abnormal and inconclusive findings on diagnostic imaging of breast: Secondary | ICD-10-CM

## 2014-06-29 NOTE — Telephone Encounter (Signed)
Patient called requesting a referral for diagnostic mammogram post abnormal findings. Sent information to Dr. Odella Aquas, [facility].  Left message on patient's identifiable voice mail that information has been relayed and if she would like to establish care we would be glad to assist.

## 2021-07-21 ENCOUNTER — Encounter (HOSPITAL_BASED_OUTPATIENT_CLINIC_OR_DEPARTMENT_OTHER): Payer: Self-pay | Admitting: *Deleted

## 2021-07-21 ENCOUNTER — Other Ambulatory Visit: Payer: Self-pay

## 2021-07-21 ENCOUNTER — Emergency Department (HOSPITAL_BASED_OUTPATIENT_CLINIC_OR_DEPARTMENT_OTHER): Payer: Medicare Other

## 2021-07-21 ENCOUNTER — Emergency Department (HOSPITAL_BASED_OUTPATIENT_CLINIC_OR_DEPARTMENT_OTHER)
Admission: EM | Admit: 2021-07-21 | Discharge: 2021-07-21 | Disposition: A | Discharge status: Home / Self Care | Payer: Medicare Other | Attending: Emergency Medicine | Admitting: Emergency Medicine

## 2021-07-21 DIAGNOSIS — M25552 Pain in left hip: Secondary | ICD-10-CM | POA: Insufficient documentation

## 2021-07-21 DIAGNOSIS — I10 Essential (primary) hypertension: Secondary | ICD-10-CM | POA: Insufficient documentation

## 2021-07-21 DIAGNOSIS — M545 Low back pain, unspecified: Secondary | ICD-10-CM | POA: Diagnosis not present

## 2021-07-21 DIAGNOSIS — R52 Pain, unspecified: Secondary | ICD-10-CM

## 2021-07-21 HISTORY — DX: Essential (primary) hypertension: I10

## 2021-07-21 HISTORY — DX: Pure hypercholesterolemia, unspecified: E78.00

## 2021-07-21 HISTORY — DX: Dorsalgia, unspecified: M54.9

## 2021-07-21 MED ORDER — LIDOCAINE 5 % EX PTCH: 1.0000 | MEDICATED_PATCH | CUTANEOUS | 0 refills | Status: AC

## 2021-07-21 MED ORDER — LIDOCAINE 5 % EX PTCH
1.0000 | MEDICATED_PATCH | CUTANEOUS | Status: DC
  Administered 2021-07-21: 1 via TRANSDERMAL
  Filled 2021-07-21: qty 1

## 2021-07-21 NOTE — Discharge Instructions (Addendum)
X-ray of her hip and lower back today were negative for fractures.  Use lidocaine patch as prescribed for pain management.  Follow-up with your spine doctor for continued management of your pain.  Use heating pad as needed for additional symptom management.  Return if symptoms worsen.

## 2021-07-21 NOTE — ED Notes (Signed)
Pt ambulatory to waiting room. Pt verbalized understanding of discharge instructions.   

## 2021-07-21 NOTE — ED Triage Notes (Signed)
Pt reports hx of low back pain. States she has been having left hip pain for the last few days. Denies known injury. Pt reports difficultly moving from sitting to standing. Family member with pt in lobby

## 2021-07-21 NOTE — ED Provider Notes (Addendum)
[facility] EMERGENCY DEPARTMENT Provider Note   CSN: [id number] Arrival date & time: 07/21/21  1434     History Chief Complaint  Patient presents with   Hip Pain    Lindsey Bender is a 80 y.o. female.  Patient with past medical history of scoliosis and low back pain presents today with left hip pain.  Patient states that same began yesterday when she sat down.  Now pain associated with sitting and standing, as well as turning.  Pain is sharp and localized to the left lateral hip region, without shooting pain down her legs or weakness.  This is a new pain for her.  She is able to ambulate without assistance.  Of note, patient takes tramadol, gabapentin, and Zanaflex for low back pain.  She is seen at the scoliosis clinic, made an appointment next week for symptoms.  She is requesting pain management to get her to this appointment.  Denies fevers, chills, changes in bowel or bladder function, numbness, tingling, or weakness  The history is provided by the patient. No language interpreter was used.  Hip Pain Pertinent negatives include no chest pain, no headaches and no shortness of breath.      Past Medical History:  Diagnosis Date   Anxiety    Back pain    Barrett esophagus 11/04/2013   EGD (Dr. Norma Fredrickson) Repeat in 3 years.   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    High cholesterol    Hypertension    Osteopenia     Patient Active Problem List   Diagnosis Date Noted   Routine general medical examination at a health care facility 03/19/2014   HSV-1 infection 03/19/2014   Need for prophylactic vaccination against Streptococcus pneumoniae (pneumococcus) and influenza 03/19/2014   Myalgia and myositis 09/27/2013   Depressive disorder, not elsewhere classified 09/27/2013   Swelling of limb 04/21/2013   Essential hypertension, benign 04/21/2013   Pain in  finger of right hand 03/29/2013   GERD (gastroesophageal reflux disease) 03/29/2013   Anxiety state, unspecified 03/29/2013   Insomnia 03/29/2013    Past Surgical History:  Procedure Laterality Date   ABDOMINAL HYSTERECTOMY     Fibroid Tumors   CARDIOVASCULAR STRESS TEST  11/21/2009   it was done due to an abnormal EKG. Her stress test was WNL   CARPAL TUNNEL RELEASE     CHOLECYSTECTOMY     TUBAL LIGATION       OB History   No obstetric history on file.     Family History  Problem Relation Age of Onset   Heart disease Father        CVD (bypass x's 4)   Alzheimer's disease Father    Heart disease Mother 60       Rheumatic heart disease   Heart attack Brother        MI x's 3--Stents   Heart disease Paternal Uncle     Social History   Tobacco Use   Smoking status: Never   Smokeless tobacco: Never  Vaping Use   Vaping Use: Never used  Substance Use Topics   Alcohol use: Yes    Comment: 1 drink/day   Drug use: No    Home Medications Prior to Admission medications   Medication Sig Start Date End Date Taking? Authorizing Provider  Calcium Citrate-Vitamin D (CITRACAL +  D PO) Take 2 tablets by mouth.    [provider]  omeprazole (PRILOSEC) 20 MG capsule Take 1 capsule (20 mg total) by mouth 2 (two) times daily. 03/17/13 04/11/14  Gillian Scarce, MD  sertraline (ZOLOFT) 50 MG tablet Take 1 tablet (50 mg total) by mouth daily. 01/10/14 01/10/15  Gillian Scarce, MD    Allergies    Codeine and Tetracyclines & related  Review of Systems   Review of Systems  Constitutional:  Negative for chills, fatigue and fever.  Respiratory:  Negative for cough and shortness of breath.   Cardiovascular:  Negative for chest pain and leg swelling.  Gastrointestinal:  Negative for constipation, diarrhea, nausea and vomiting.  Genitourinary:  Negative for difficulty urinating, dysuria and urgency.  Musculoskeletal:  Positive for arthralgias, back pain and myalgias. Negative for  neck pain and neck stiffness.  Skin:  Negative for wound.  Neurological:  Negative for dizziness, tremors, seizures, syncope, facial asymmetry, speech difficulty, weakness, light-headedness, numbness and headaches.  Psychiatric/Behavioral:  Negative for confusion and decreased concentration.   All other systems reviewed and are negative.  Physical Exam Updated Vital Signs BP (!) 152/67 (BP Location: Right Arm)   Pulse (!) 59   Temp 98.2 F (36.8 C) (Oral)   Resp 14   Ht 5\' 5"  (1.651 m)   Wt 70.3 kg   SpO2 98%   BMI 25.79 kg/m   Physical Exam Vitals and nursing note reviewed.  Constitutional:      General: She is not in acute distress.    Appearance: Normal appearance. She is normal weight. She is not ill-appearing, toxic-appearing or diaphoretic.  HENT:     Head: Normocephalic and atraumatic.  Eyes:     General: No scleral icterus.    Conjunctiva/sclera: Conjunctivae normal.  Cardiovascular:     Rate and Rhythm: Normal rate.  Pulmonary:     Effort: Pulmonary effort is normal. No respiratory distress.  Musculoskeletal:        General: Normal range of motion.     Cervical back: Normal range of motion and neck supple.     Lumbar back: Normal.     Comments: Patient notes pain to left lateral lower back area, however no tenderness noted to this area.  Straight leg raise negative.  No midline tenderness noted to cervical, thoracic, or lumbar spine.  No tenderness noted to left hip region either.  Skin:    General: Skin is warm and dry.  Neurological:     General: No focal deficit present.     Mental Status: She is alert.     Motor: Motor function is intact.  Psychiatric:        Mood and Affect: Mood normal.        Behavior: Behavior normal.    ED Results / Procedures / Treatments   Labs (all labs ordered are listed, but only abnormal results are displayed) Labs Reviewed - No data to display  EKG None  Radiology No results found.  Procedures Procedures    Medications Ordered in ED Medications - No data to display  ED Course  I have reviewed the triage vital signs and the nursing notes.  Pertinent labs & imaging results that were available during my care of the patient were reviewed by me and considered in my medical decision making (see chart for details).  Clinical Course as of 07/21/21 1904  Sat Jul 21, 2021  [id number] 80 year old female with chronic back issues here with a new  back pain left paralumbar been going on for the last few days.  Worse with getting from sitting to standing.  No numbness or weakness no abdominal pain.  No trauma.  She is on multiple medications already for her back.  We will check some imaging.  Symptomatic treatment. [MB]    Clinical Course User Index [MB] Terrilee Files, MD   MDM Rules/Calculators/A&P                         Presents today for left hip pain.  Of note patient has longstanding chronic low back pain for which she takes multiple medications.  This pain is new since yesterday and exacerbated upon sitting and standing.  No radiating pain, weakness, numbness, tingling.  Imaging of lumbar spine and left hip unremarkable for acute changes.  Patient has appointment with her spine doctor next week, she is just requesting pain management to get her to this appointment.  Patient has no neurological deficits and normal neuro exam.  Patient can walk but states is painful, however pain is localized to left lateral back area. No loss of bowel or bladder control.  No concern for cauda equina.  No fever, night sweats, weight loss, h/o cancer, IVDU.  RICE protocol and pain medicine indicated and discussed with patient.  Patient is amenable with plans of discharge, and advised of red flag symptoms that would prompt return.  Of note, when collected discharge vitals, blood pressure noted to be 195 systolic.  However, normally takes her Olmesartan at 6 PM every day, which she has not done yet.  Advised patient to take  when she gets home and monitor blood pressure closely afterwards.  Advised patient to return if unable to lower blood pressure or if symptoms of hypertensive emergency develop.  Patient is amenable with this plan.  This is a shared visit with supervising physician Dr. Charm Barges who has independently evaluated patient & provided guidance in evaluation/management/disposition, in agreement with care    Final Clinical Impression(s) / ED Diagnoses Final diagnoses:  Left hip pain    Rx / DC Orders ED Discharge Orders          Ordered    lidocaine (LIDODERM) 5 %  Every 24 hours        07/21/21 1843          An After Visit Summary was printed and given to the patient.    Vear Clock 07/21/21 1853    Silva Bandy, PA-C 07/21/21 1907    Terrilee Files, MD 07/22/21 1027

## 2023-02-26 IMAGING — CR DG LUMBAR SPINE COMPLETE 4+V
5 series · 5 of 5 positions shown · non-contrast
Comparison: MRI lumbar spine 07/01/2019.

CLINICAL DATA: Back pain.

EXAM:
LUMBAR SPINE - COMPLETE 4+ VIEW

[t l-spine a.p.]
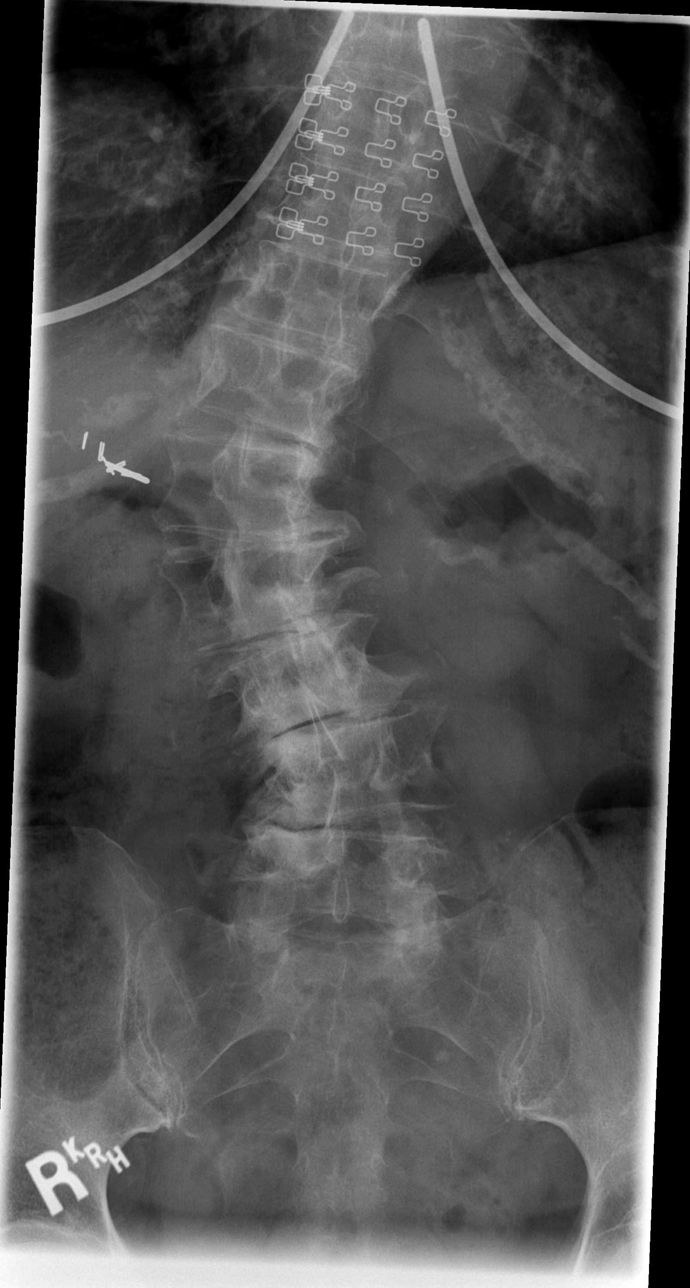

[t l-spine oblique exposure (1 of 2)]
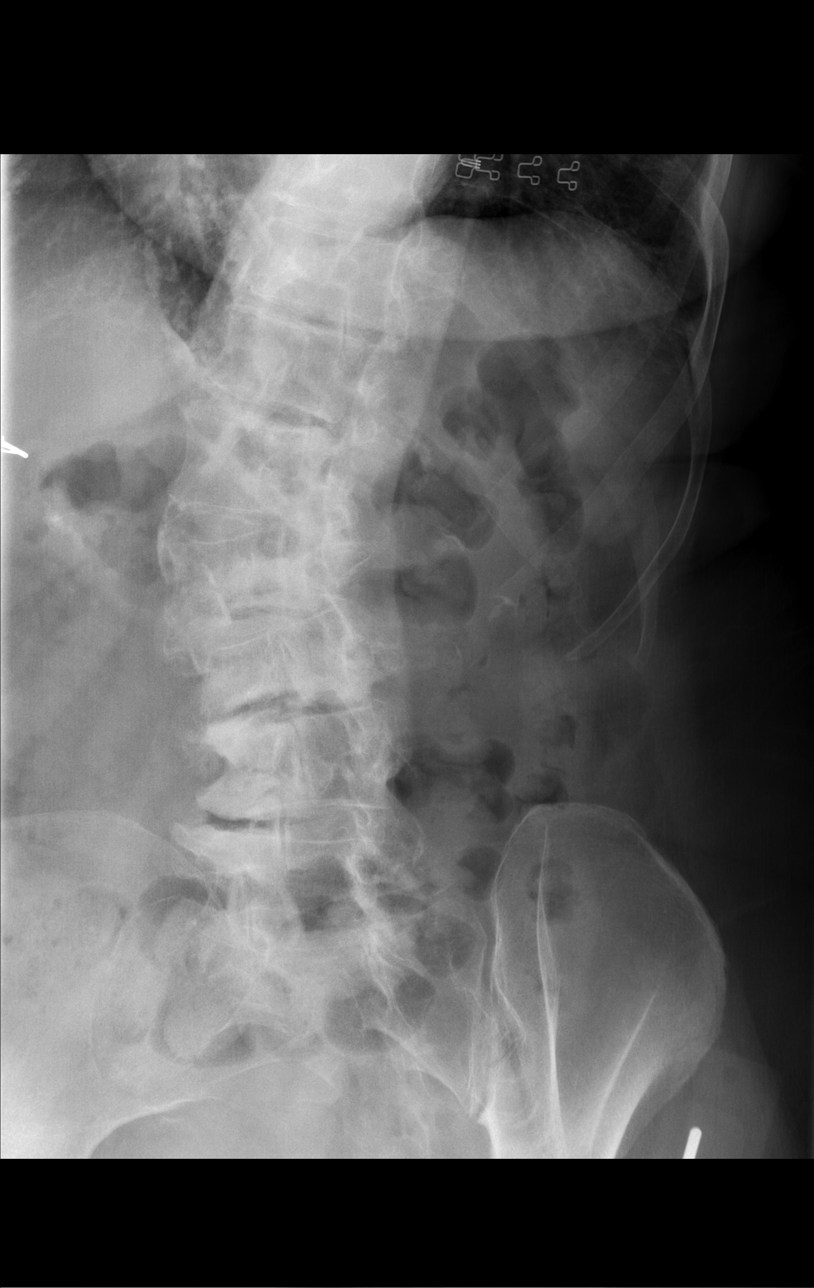

[t l-spine oblique exposure (2 of 2)]
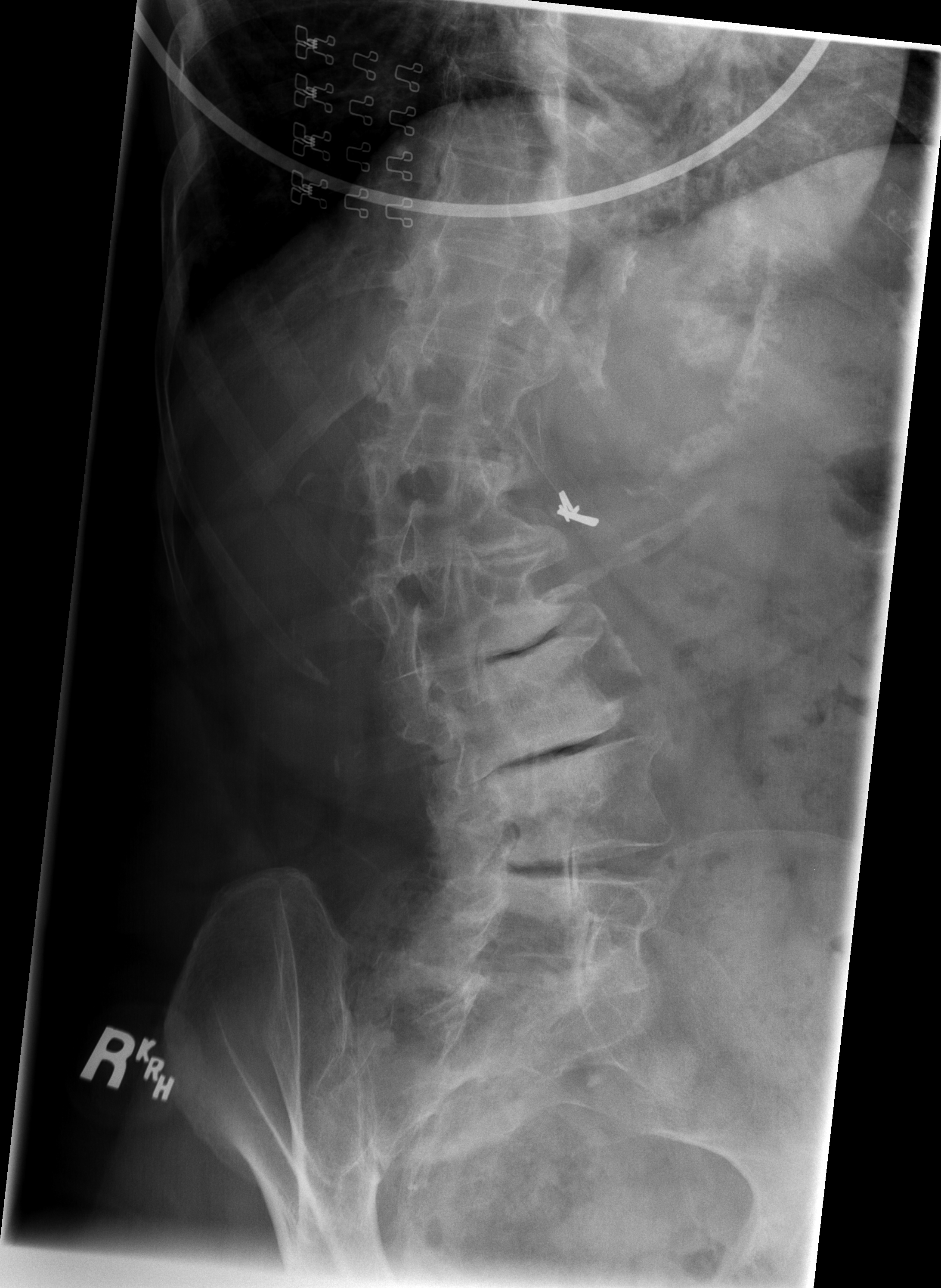

[t l-spine lat]
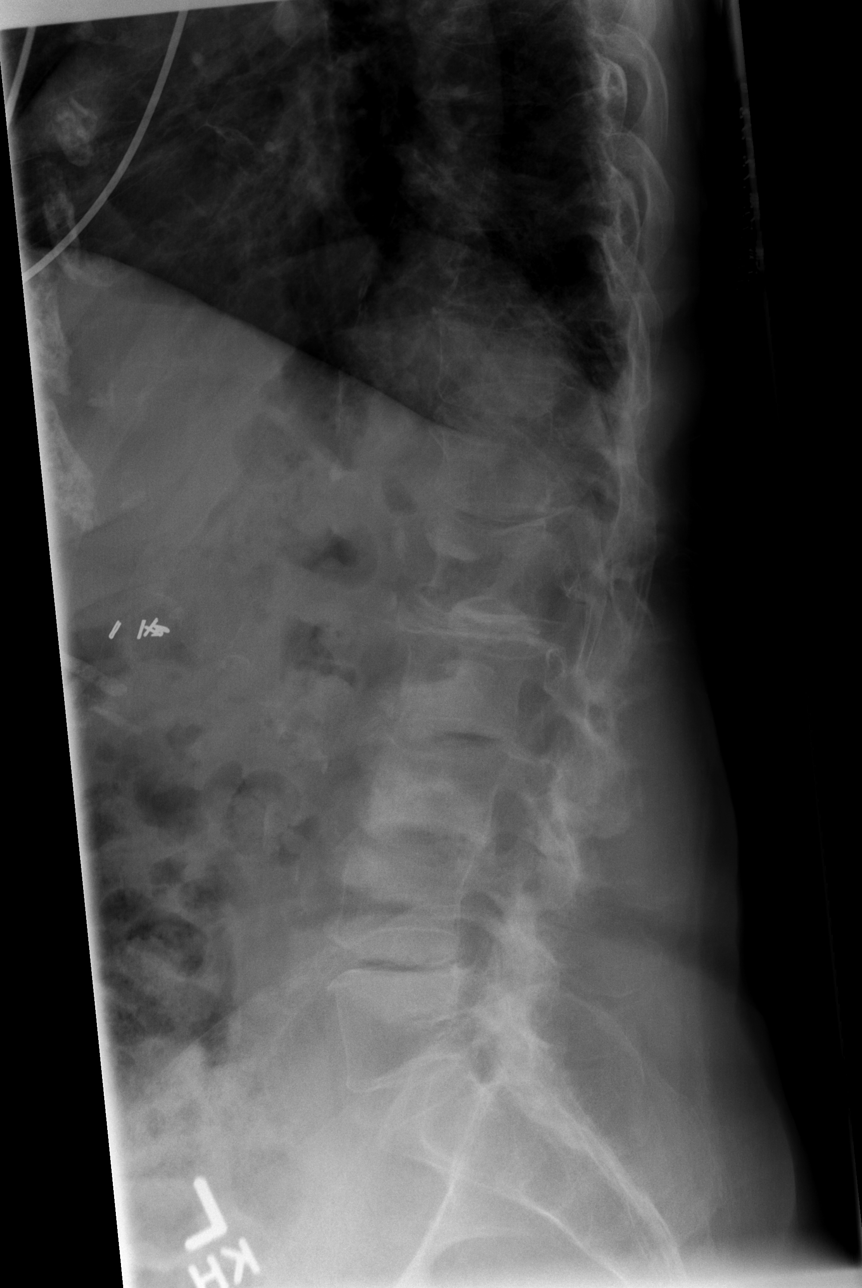

[t l-spine l5-s1 spot]
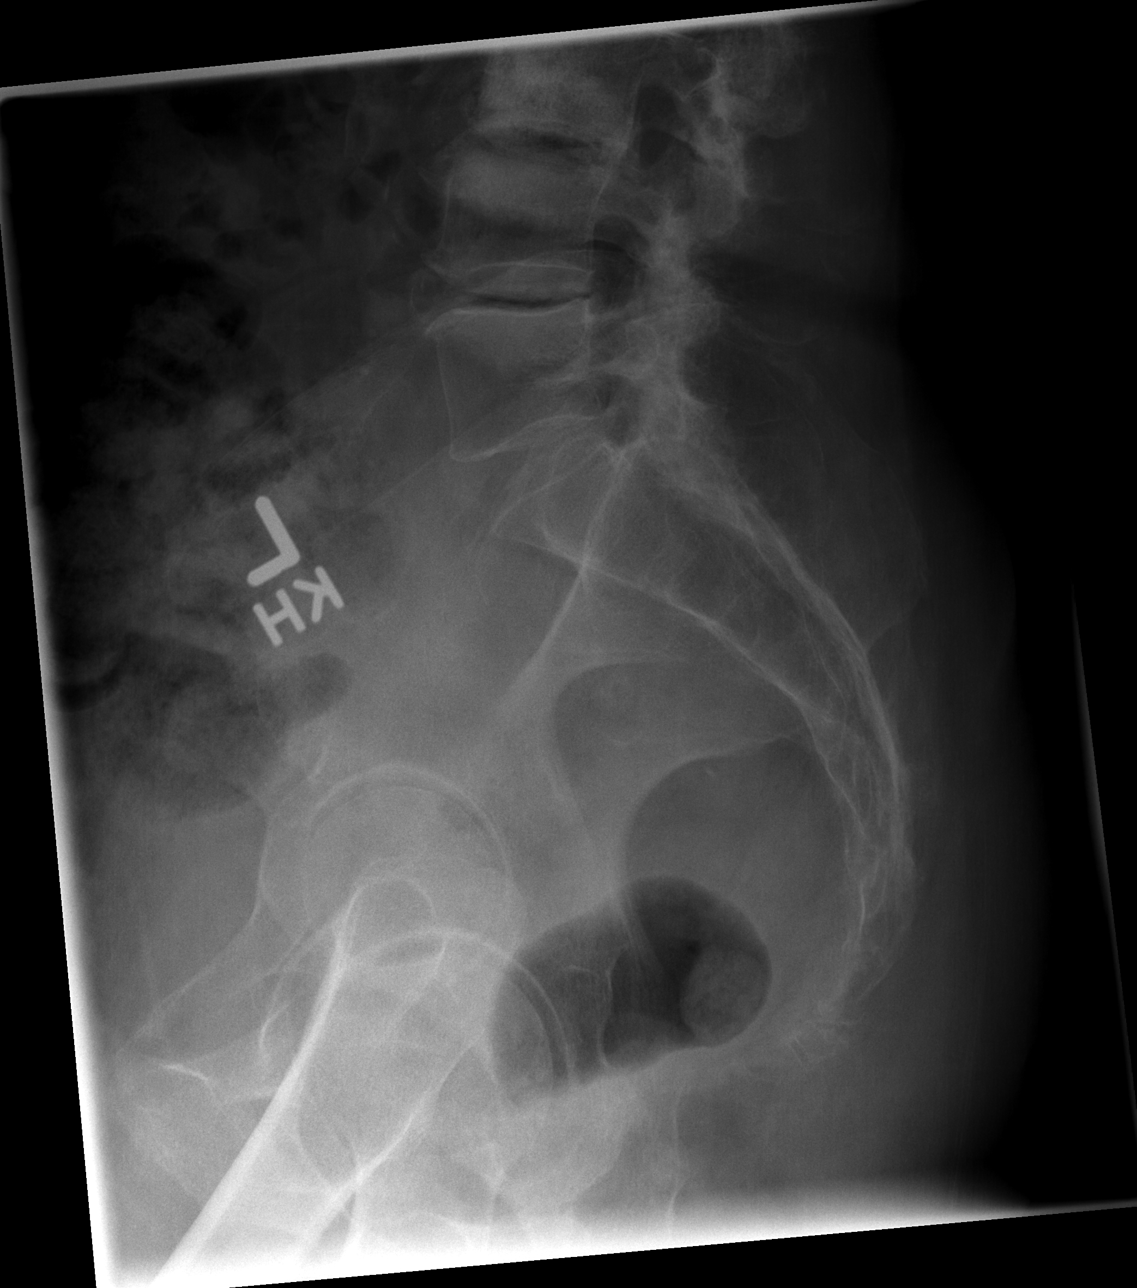

[5 of 5 positions shown; findings below may reference images not displayed]

FINDINGS: There is S shaped curvature of the lumbar spine similar to the prior
study. There is moderate disc space narrowing, endplate osteophyte
formation, sclerosis and vacuum disc phenomena at L3-L4, L4-L5 and
L5-S1 compatible with degenerative change. Alignment appears
anatomic. There is no definite acute fracture. There are surgical
clips in the right abdomen.
IMPRESSION: 1. No acute fracture or dislocation.
2. Stable scoliosis and moderate/severe degenerative changes.

## 2023-03-27 ENCOUNTER — Emergency Department (HOSPITAL_BASED_OUTPATIENT_CLINIC_OR_DEPARTMENT_OTHER)
Admission: EM | Admit: 2023-03-27 | Discharge: 2023-03-27 | Disposition: A | Discharge status: Home / Self Care | Payer: Medicare Other | Attending: Emergency Medicine | Admitting: Emergency Medicine

## 2023-03-27 ENCOUNTER — Other Ambulatory Visit: Payer: Self-pay

## 2023-03-27 ENCOUNTER — Emergency Department (HOSPITAL_BASED_OUTPATIENT_CLINIC_OR_DEPARTMENT_OTHER): Payer: Medicare Other

## 2023-03-27 ENCOUNTER — Encounter (HOSPITAL_BASED_OUTPATIENT_CLINIC_OR_DEPARTMENT_OTHER): Payer: Self-pay | Admitting: Pediatrics

## 2023-03-27 DIAGNOSIS — R112 Nausea with vomiting, unspecified: Secondary | ICD-10-CM | POA: Diagnosis present

## 2023-03-27 DIAGNOSIS — T18198A Other foreign object in esophagus causing other injury, initial encounter: Secondary | ICD-10-CM | POA: Insufficient documentation

## 2023-03-27 DIAGNOSIS — I10 Essential (primary) hypertension: Secondary | ICD-10-CM | POA: Diagnosis not present

## 2023-03-27 DIAGNOSIS — W448XXA Other foreign body entering into or through a natural orifice, initial encounter: Secondary | ICD-10-CM | POA: Diagnosis not present

## 2023-03-27 DIAGNOSIS — T189XXA Foreign body of alimentary tract, part unspecified, initial encounter: Secondary | ICD-10-CM

## 2023-03-27 LAB — CBC WITH DIFFERENTIAL/PLATELET
Abs Immature Granulocytes: 0.04 10*3/uL (ref 0.00–0.07)
Basophils Absolute: 0 10*3/uL (ref 0.0–0.1)
Basophils Relative: 0 %
Eosinophils Absolute: 0.2 10*3/uL (ref 0.0–0.5)
Eosinophils Relative: 2 %
HCT: 38.7 % (ref 36.0–46.0)
Hemoglobin: 13 g/dL (ref 12.0–15.0)
Immature Granulocytes: 0 %
Lymphocytes Relative: 20 %
Lymphs Abs: 2.1 10*3/uL (ref 0.7–4.0)
MCH: 30.7 pg (ref 26.0–34.0)
MCHC: 33.6 g/dL (ref 30.0–36.0)
MCV: 91.3 fL (ref 80.0–100.0)
Monocytes Absolute: 1 10*3/uL (ref 0.1–1.0)
Monocytes Relative: 10 %
Neutro Abs: 6.9 10*3/uL (ref 1.7–7.7)
Neutrophils Relative %: 68 %
Platelets: 324 10*3/uL (ref 150–400)
RBC: 4.24 MIL/uL (ref 3.87–5.11)
RDW: 13.7 % (ref 11.5–15.5)
WBC: 10.2 10*3/uL (ref 4.0–10.5)
nRBC: 0 % (ref 0.0–0.2)

## 2023-03-27 LAB — TROPONIN I (HIGH SENSITIVITY): Troponin I (High Sensitivity): 7 ng/L (ref <18)

## 2023-03-27 LAB — COMPREHENSIVE METABOLIC PANEL
ALT: 18 U/L (ref 0–44)
AST: 32 U/L (ref 15–41)
Albumin: 4.2 g/dL (ref 3.5–5.0)
Alkaline Phosphatase: 110 U/L (ref 38–126)
Anion gap: 12 (ref 5–15)
BUN: 23 mg/dL (ref 8–23)
CO2: 25 mmol/L (ref 22–32)
Calcium: 9.3 mg/dL (ref 8.9–10.3)
Chloride: 98 mmol/L (ref 98–111)
Creatinine, Ser: 1.44 mg/dL — ABNORMAL HIGH (ref 0.44–1.00)
GFR, Estimated: 37 mL/min — ABNORMAL LOW (ref >60)
Glucose, Bld: 108 mg/dL — ABNORMAL HIGH (ref 70–99)
Potassium: 4.3 mmol/L (ref 3.5–5.1)
Sodium: 135 mmol/L (ref 135–145)
Total Bilirubin: 1 mg/dL (ref 0.3–1.2)
Total Protein: 7.7 g/dL (ref 6.5–8.1)

## 2023-03-27 LAB — LIPASE, BLOOD: Lipase: 31 U/L (ref 11–51)

## 2023-03-27 SURGERY — EGD (ESOPHAGOGASTRODUODENOSCOPY)
Anesthesia: Monitor Anesthesia Care

## 2023-03-27 MED ORDER — SODIUM CHLORIDE 0.9 % IV BOLUS (SEPSIS)
500.0000 mL | Freq: Once | INTRAVENOUS | Status: AC
  Administered 2023-03-27: 500 mL via INTRAVENOUS

## 2023-03-27 MED ORDER — GLUCAGON HCL RDNA (DIAGNOSTIC) 1 MG IJ SOLR
1.0000 mg | Freq: Once | INTRAMUSCULAR | Status: AC
  Administered 2023-03-27: 1 mg via INTRAVENOUS
  Filled 2023-03-27: qty 1

## 2023-03-27 MED ORDER — ONDANSETRON HCL 4 MG/2ML IJ SOLN
4.0000 mg | Freq: Once | INTRAMUSCULAR | Status: AC
  Administered 2023-03-27: 4 mg via INTRAVENOUS
  Filled 2023-03-27: qty 2

## 2023-03-27 MED ORDER — ALUM & MAG HYDROXIDE-SIMETH 200-200-20 MG/5ML PO SUSP
30.0000 mL | Freq: Once | ORAL | Status: AC
  Administered 2023-03-27: 30 mL via ORAL
  Filled 2023-03-27: qty 30

## 2023-03-27 NOTE — ED Provider Notes (Signed)
[place] [facility] Provider Note   CSN: [id number] Arrival date & time: 03/27/23  1356     History HTN,GERD Chief Complaint  Patient presents with   Emesis    Lindsey Bender is a 82 y.o. female.  82 y.o female with a PMH of GERD, Depression, HTN, presents to the ED with a chief complaint of nausea and vomiting. Patient reports he was at breakfast with her friend, when suddenly she took a handful approximately 12 pills of her home meds and once, reports that she feels like one of them is stuck in the middle of her throat.  She reports she keeps trying to cough this up however it is not coming out.  She has a foreign body sensation in the middle of her throat.  She was given Zofran to help with her nausea when she arrived to the emergency department, she was also found to be hypotensive with a systolic in the 80s later rechecked normotensive.She denies any shortness of breath, chest pain or prior hx of esophageal obstruction.   The history is provided by the patient and medical records.  Emesis Associated symptoms: no abdominal pain, no chills, no fever and no sore throat        Home Medications Prior to Admission medications   Medication Sig Start Date End Date Taking? Authorizing Provider  Calcium Citrate-Vitamin D (CITRACAL + D PO) Take 2 tablets by mouth.    [provider]  lidocaine (LIDODERM) 5 % Place 1 patch onto the skin daily. Remove & Discard patch within 12 hours or as directed by MD 07/21/21   Smoot, Shawn Route, PA-C  omeprazole (PRILOSEC) 20 MG capsule Take 1 capsule (20 mg total) by mouth 2 (two) times daily. 03/17/13 04/11/14  Gillian Scarce, MD  sertraline (ZOLOFT) 50 MG tablet Take 1 tablet (50 mg total) by mouth daily. 01/10/14 01/10/15  Gillian Scarce, MD      Allergies    Codeine and Tetracyclines & related    Review of Systems   Review of Systems  Constitutional:  Negative for chills and fever.  HENT:  Negative for  sore throat.   Respiratory:  Positive for choking. Negative for shortness of breath.   Cardiovascular:  Negative for chest pain.  Gastrointestinal:  Positive for vomiting. Negative for abdominal pain and nausea.  Genitourinary:  Negative for flank pain.  Musculoskeletal:  Negative for back pain.  All other systems reviewed and are negative.   Physical Exam Updated Vital Signs BP 123/60   Pulse 60   Temp 99.1 F (37.3 C)   Resp 18   Ht 5\' 6"  (1.676 m)   Wt 70.3 kg   SpO2 100%   BMI 25.02 kg/m  Physical Exam Vitals and nursing note reviewed.  Constitutional:      Appearance: She is ill-appearing.     Comments: Actively dry heaving  HENT:     Head: Normocephalic and atraumatic.     Mouth/Throat:     Mouth: Mucous membranes are moist.  Eyes:     Pupils: Pupils are equal, round, and reactive to light.  Cardiovascular:     Rate and Rhythm: Normal rate.  Pulmonary:     Effort: Pulmonary effort is normal.     Breath sounds: No wheezing.  Abdominal:     General: Abdomen is flat.     Palpations: Abdomen is soft.     Tenderness: There is no abdominal tenderness.  Musculoskeletal:  Cervical back: Normal range of motion and neck supple.  Skin:    General: Skin is warm and dry.  Neurological:     Mental Status: She is alert and oriented to person, place, and time.     ED Results / Procedures / Treatments   Labs (all labs ordered are listed, but only abnormal results are displayed) Labs Reviewed  COMPREHENSIVE METABOLIC PANEL - Abnormal; Notable for the following components:      Result Value   Glucose, Bld 108 (*)    Creatinine, Ser 1.44 (*)    GFR, Estimated 37 (*)    All other components within normal limits  CBC WITH DIFFERENTIAL/PLATELET  LIPASE, BLOOD  TROPONIN I (HIGH SENSITIVITY)    EKG EKG Interpretation  Date/Time:  Thursday Mar 27 2023 14:26:07 EDT Ventricular Rate:  65 PR Interval:  177 QRS Duration: 89 QT Interval:  411 QTC Calculation: 428 R  Axis:   14 Text Interpretation: Sinus rhythm No previous ECGs available Confirmed by Vivien Rossetti ([id number]) on 03/27/2023 2:28:41 PM  Radiology DG Neck Soft Tissue  Result Date: 03/27/2023 CLINICAL DATA:  Foreign body sensation in the neck. EXAM: NECK SOFT TISSUES - 1+ VIEW COMPARISON:  None Available. FINDINGS: Two views of the neck soft tissues. Radiodensity in the cervical esophagus with proximal dilatation. Degenerative changes of the cervical spine. Atherosclerotic calcifications of the carotid bulbs. IMPRESSION: Radiodensity in the cervical esophagus with proximal dilatation. Electronically Signed   By: Orvan Falconer M.D.   On: 03/27/2023 15:12   DG Chest 2 View  Result Date: 03/27/2023 CLINICAL DATA:  Foreign body sensation in the neck. EXAM: CHEST - 2 VIEW COMPARISON:  08/27/2022. FINDINGS: Clear lungs. Stable cardiac and mediastinal contours. No pleural effusion or pneumothorax. Visualized bones and upper abdomen are unremarkable. No radiopaque foreign body. IMPRESSION: No evidence of acute cardiopulmonary disease. No radiopaque foreign body. Electronically Signed   By: Orvan Falconer M.D.   On: 03/27/2023 15:09    Procedures Procedures    Medications Ordered in ED Medications  ondansetron (ZOFRAN) injection 4 mg (4 mg Intravenous Given 03/27/23 1428)  sodium chloride 0.9 % bolus 500 mL (0 mLs Intravenous Stopped 03/27/23 1716)  glucagon (human recombinant) (GLUCAGEN) injection 1 mg (1 mg Intravenous Given 03/27/23 1459)  ondansetron (ZOFRAN) injection 4 mg (4 mg Intravenous Given 03/27/23 1459)  alum & mag hydroxide-simeth (MAALOX/MYLANTA) 200-200-20 MG/5ML suspension 30 mL (30 mLs Oral Given 03/27/23 1716)    ED Course/ Medical Decision Making/ A&P Clinical Course as of 03/27/23 1722  Thu Mar 27, 2023  1550 Pill obstruction Tx for procedure [CC]  1716 Repeat x-ray shows resolution of obstruction.  Patient feels comfortable with p.o. tolerance now.  Will trial oral hydration  follow-up with PCP within 72 hours for repeat lab work for AKI. [CC]    Clinical Course User Index [CC] Glyn Ade, MD                             Medical Decision Making Amount and/or Complexity of Data Reviewed Labs: ordered. Radiology: ordered.  Risk OTC drugs. Prescription drug management.   This patient presents to the ED for concern of emesis, this involves a number of treatment options, and is a complaint that carries with it a high risk of complications and morbidity.  The differential diagnosis includes food impaction, esophageal obstruction versus infection.    Co morbidities: Discussed in HPI   Brief History:  See HPI.  EMR reviewed including pt PMHx, past surgical history and past visits to ER.   See HPI for more details   Lab Tests:  I ordered and independently interpreted labs.  The pertinent results include:    Labs notable for CBC with no leukocytosis, hemoglobin is within normal limits. CMP with no electrolyte derangement. Creatine has double since her last visit. Troponin is negative.    Imaging Studies:  DG Neck soft tissue: Radiodensity in the cervical esophagus with proximal dilatation.     Xray of the chest: IMPRESSION:  No evidence of acute cardiopulmonary disease. No radiopaque foreign  body.    Cardiac Monitoring:  The patient was maintained on a cardiac monitor.  I personally viewed and interpreted the cardiac monitored which showed an underlying rhythm of: NSR 65 EKG non-ischemic   Medicines ordered:  I ordered medication including glucagon, zofran x 2, bolus  for symptomatic treatment.  Reevaluation of the patient after these medicines showed that the patient stayed the same I have reviewed the patients home medicines and have made adjustments as needed  Reevaluation:  After the interventions noted above I re-evaluated patient and found that they have :stayed the same  Consults:  Spoke to Dr. Phillips [facility] who  recommended ED to ED transfer for scope.    Social Determinants of Health:  The patient's social determinants of health were a factor in the care of this patient  Problem List / ED Course:  Patient presents to the ED status post nausea and vomiting of sudden onset at breakfast, after she tried to swallow her 12 pills, of her daily meds at once.  Reports she does this daily, however today she felt like one of them got stuck on her throat, she has tried water, given Zofran, will try glucagon, GI cocktail without any improvement in her symptoms.  She arrived to the ED hypotensive, started on a gentle hydration with 250 bolus.  Labs found to have a AKI on today's visit, doubling her creatinine since the last visit.  X-ray of soft tissue that show a foreign body present, call was placed for gastroenterology in order to obtain further recommendations.Spoke to GI on call who recommended transfer to ED for endoscopy.  Patient had a burping episode while in the ED, reports she feels like she has likely passed her pill.  We did repeat a x-ray of soft tissue neck which shows that the radiopacity object on the upper esophagus is no longer visualized.  She does endorse some soreness, however she is able to tolerate her secretions, is tolerating p.o.  Given the Maalox and the lidocaine at this time to help with her soreness.  She is overall hemodynamically stable.  We did discuss her elevated kidney function, she reports she will continue to hydrate with plenty of fluids over the next 48 hours, we did discuss having this level rechecked by her primary care physician.  Patient is hemodynamically stable for discharge.  Dispostion:  After consideration of the diagnostic results and the patients response to treatment, I feel that the patent would benefit from RECHECK OF creatine by PCP.     Portions of this note were generated with Scientist, clinical (histocompatibility and immunogenetics). Dictation errors may occur despite best attempts at  proofreading.  Final Clinical Impression(s) / ED Diagnoses Final diagnoses:  Foreign body in digestive tract, initial encounter  Nausea and vomiting, unspecified vomiting type    Rx / DC Orders ED Discharge Orders     None  Claude Manges, PA-C 03/27/23 1722    Glyn Ade, MD 04/04/23 1511

## 2023-03-27 NOTE — Discharge Instructions (Signed)
Your creatine level was elevated at 1.4 from your baseline of 0.7, please hydrate with plenty of fluids.   Follow up with your primary care physician for a repeat in your labs within the next week.

## 2023-03-27 NOTE — ED Notes (Signed)
PO challenge tolerated.

## 2023-03-27 NOTE — ED Notes (Signed)
Pt no longer surgical candidate. Issues resolved. Pt discharged by provider home.

## 2023-03-27 NOTE — ED Triage Notes (Signed)
C/O vomiting, reported she took a handful of her morning med and there might have been stuck in her throat.

## 2023-07-24 ENCOUNTER — Emergency Department (HOSPITAL_BASED_OUTPATIENT_CLINIC_OR_DEPARTMENT_OTHER)
Admission: EM | Admit: 2023-07-24 | Discharge: 2023-07-24 | Disposition: A | Discharge status: Home / Self Care | Payer: Medicare Other | Attending: Emergency Medicine | Admitting: Emergency Medicine

## 2023-07-24 ENCOUNTER — Emergency Department (HOSPITAL_BASED_OUTPATIENT_CLINIC_OR_DEPARTMENT_OTHER): Payer: Medicare Other

## 2023-07-24 ENCOUNTER — Encounter (HOSPITAL_BASED_OUTPATIENT_CLINIC_OR_DEPARTMENT_OTHER): Payer: Self-pay | Admitting: Emergency Medicine

## 2023-07-24 ENCOUNTER — Other Ambulatory Visit: Payer: Self-pay

## 2023-07-24 DIAGNOSIS — I10 Essential (primary) hypertension: Secondary | ICD-10-CM | POA: Insufficient documentation

## 2023-07-24 DIAGNOSIS — R0602 Shortness of breath: Secondary | ICD-10-CM | POA: Insufficient documentation

## 2023-07-24 DIAGNOSIS — D649 Anemia, unspecified: Secondary | ICD-10-CM | POA: Diagnosis not present

## 2023-07-24 LAB — CBC WITH DIFFERENTIAL/PLATELET
Abs Immature Granulocytes: 0.02 10*3/uL (ref 0.00–0.07)
Basophils Absolute: 0 10*3/uL (ref 0.0–0.1)
Basophils Relative: 1 %
Eosinophils Absolute: 0.1 10*3/uL (ref 0.0–0.5)
Eosinophils Relative: 2 %
HCT: 34.2 % — ABNORMAL LOW (ref 36.0–46.0)
Hemoglobin: 11.6 g/dL — ABNORMAL LOW (ref 12.0–15.0)
Immature Granulocytes: 0 %
Lymphocytes Relative: 31 %
Lymphs Abs: 2 10*3/uL (ref 0.7–4.0)
MCH: 31.1 pg (ref 26.0–34.0)
MCHC: 33.9 g/dL (ref 30.0–36.0)
MCV: 91.7 fL (ref 80.0–100.0)
Monocytes Absolute: 0.6 10*3/uL (ref 0.1–1.0)
Monocytes Relative: 9 %
Neutro Abs: 3.8 10*3/uL (ref 1.7–7.7)
Neutrophils Relative %: 57 %
Platelets: 287 10*3/uL (ref 150–400)
RBC: 3.73 MIL/uL — ABNORMAL LOW (ref 3.87–5.11)
RDW: 13.8 % (ref 11.5–15.5)
WBC: 6.6 10*3/uL (ref 4.0–10.5)
nRBC: 0 % (ref 0.0–0.2)

## 2023-07-24 LAB — BASIC METABOLIC PANEL
Anion gap: 11 (ref 5–15)
BUN: 25 mg/dL — ABNORMAL HIGH (ref 8–23)
CO2: 24 mmol/L (ref 22–32)
Calcium: 9.1 mg/dL (ref 8.9–10.3)
Chloride: 100 mmol/L (ref 98–111)
Creatinine, Ser: 1.43 mg/dL — ABNORMAL HIGH (ref 0.44–1.00)
GFR, Estimated: 37 mL/min — ABNORMAL LOW (ref >60)
Glucose, Bld: 118 mg/dL — ABNORMAL HIGH (ref 70–99)
Potassium: 4.1 mmol/L (ref 3.5–5.1)
Sodium: 135 mmol/L (ref 135–145)

## 2023-07-24 LAB — TROPONIN I (HIGH SENSITIVITY): Troponin I (High Sensitivity): 6 ng/L (ref <18)

## 2023-07-24 NOTE — ED Triage Notes (Signed)
Pt reports having COVID last week, reports increased ShoB the "last few days", denies CP, n/v, CP, fever, or other symptoms

## 2023-07-24 NOTE — ED Provider Notes (Signed)
[place] [facility] Provider Note   CSN: [id number] Arrival date & time: 07/24/23  1602     History  Chief Complaint  Patient presents with   Shortness of Breath    Lindsey Bender is a 82 y.o. female with a past medical history of hypertension who presents emergency department for shortness of breath after having COVID a week prior.  Patient stated that while she had COVID, she had typical upper respiratory infection symptoms such as congestion and cough.  She stated that she did not have any shortness of breath while she had the other upper respiratory virus symptoms.  Patient stated that a couple days ago she developed shortness of breath and it has worsened.  She has shortness of breath at rest and with exertion.  She denies fever, chills, chest pain, nausea, vomiting, recent travel, recent surgery leg pain or swelling.  She denies overt blood loss, hematochezia, melena, hematuria, hemoptysis, or hematemesis. Patient does report feeling lightheaded while experiencing shortness of breath.  Daughter is present in the room and is concerned that her shortness of breath is caused from anxiety due to patient's husband having Alzheimer disease and patient being the primary caregiver.       Home Medications Prior to Admission medications   Medication Sig Start Date End Date Taking? Authorizing Provider  Calcium Citrate-Vitamin D (CITRACAL + D PO) Take 2 tablets by mouth.    [provider]  lidocaine (LIDODERM) 5 % Place 1 patch onto the skin daily. Remove & Discard patch within 12 hours or as directed by MD 07/21/21   Smoot, Shawn Route, PA-C  omeprazole (PRILOSEC) 20 MG capsule Take 1 capsule (20 mg total) by mouth 2 (two) times daily. 03/17/13 04/11/14  Gillian Scarce, MD  sertraline (ZOLOFT) 50 MG tablet Take 1 tablet (50 mg total) by mouth daily. 01/10/14 01/10/15  Gillian Scarce, MD      Allergies    Codeine and Tetracyclines & related    Review  of Systems   Review of Systems  Constitutional:  Negative for chills and fever.  Respiratory:  Positive for shortness of breath. Negative for cough, chest tightness and wheezing.   Cardiovascular:  Negative for chest pain and leg swelling.    Physical Exam Updated Vital Signs BP (!) 151/81 (BP Location: Right Arm)   Pulse (!) 52   Temp 98 F (36.7 C) (Oral)   Resp (!) 22   Ht 5\' 5"  (1.651 m)   Wt 70.3 kg   SpO2 98%   BMI 25.79 kg/m  Physical Exam Vitals reviewed.  Constitutional:      General: She is not in acute distress.    Appearance: She is well-developed. She is not ill-appearing, toxic-appearing or diaphoretic.  Cardiovascular:     Rate and Rhythm: Normal rate and regular rhythm.     Heart sounds: Normal heart sounds. No murmur heard. Pulmonary:     Effort: Pulmonary effort is normal. No tachypnea, accessory muscle usage or respiratory distress.     Breath sounds: Normal breath sounds.  Musculoskeletal:     Right lower leg: No tenderness. No edema.     Left lower leg: No tenderness. No edema.  Skin:    General: Skin is warm and dry.  Neurological:     Mental Status: She is alert and oriented to person, place, and time.  Psychiatric:        Mood and Affect: Mood normal.  ED Results / Procedures / Treatments   Labs (all labs ordered are listed, but only abnormal results are displayed) Labs Reviewed  CBC WITH DIFFERENTIAL/PLATELET - Abnormal; Notable for the following components:      Result Value   RBC 3.73 (*)    Hemoglobin 11.6 (*)    HCT 34.2 (*)    All other components within normal limits  BASIC METABOLIC PANEL - Abnormal; Notable for the following components:   Glucose, Bld 118 (*)    BUN 25 (*)    Creatinine, Ser 1.43 (*)    GFR, Estimated 37 (*)    All other components within normal limits  TROPONIN I (HIGH SENSITIVITY)    EKG EKG Interpretation Date/Time:  Thursday July 24 2023 16:11:36 EDT Ventricular Rate:  57 PR  Interval:  170 QRS Duration:  88 QT Interval:  449 QTC Calculation: 438 R Axis:   30  Text Interpretation: Sinus rhythm Anterior infarct, old No significant change since last tracing Confirmed by Melene Plan [phone]) on 07/24/2023 4:25:25 PM  Radiology DG Chest 2 View  Result Date: 07/24/2023 CLINICAL DATA:  Shortness of breath EXAM: CHEST - 2 VIEW COMPARISON:  Chest x-ray dated Mar 27, 2023 FINDINGS: The heart size and mediastinal contours are within normal limits. Mild left basilar atelectasis. Both lungs are otherwise clear. Dextrocurvature of the the thoracolumbar spine. IMPRESSION: No active cardiopulmonary disease. Electronically Signed   By: Allegra Lai M.D.   On: 07/24/2023 17:50    Procedures Procedures    Medications Ordered in ED Medications - No data to display  ED Course/ Medical Decision Making/ A&P                                 Medical Decision Making Patient is an 82 year old female with past medical history of hypertension was recently diagnosed with COVID 1 week ago and presents for evaluation of new shortness of breath.  Initial vital signs shows tachypnea, 90% oxygen saturation on room air, afebrile.  Physical exam shows lungs clear to auscultate bilaterally, no tachypnea, oxygen saturation of 99% on room air, and a well-appearing elderly female.  Initial etiologies considered are pneumonia, acute anemia, ACS, and pulmonary emboli.  Upon reevaluation, chest x-ray does not show acute cardiopulmonary findings, lab work shows anemia at 11.6 hemoglobin, BMP is stable, and patient is saturating well on room air and appearing well.  The shortness of breath could possibly be due to COVID since she had a COVID infection within the past week or could be due to physical deconditioning from resting while ill.  ACS is unlikely, EKG is reassuring and troponin is 6. Patient was discharged home in stable condition with instructions to follow-up with primary care physician within 1  to 2 weeks and to return if symptoms worsen, she develops chest pain, or symptoms fail to improve.  Amount and/or Complexity of Data Reviewed Labs: ordered.    Details: CBC: Hemoglobin 11.6, no leukocytosis BMP: Stable Troponin: 6 Radiology: ordered and independent interpretation performed.    Details: Agree with radiologist findings           Final Clinical Impression(s) / ED Diagnoses Final diagnoses:  SOB (shortness of breath)    Rx / DC Orders ED Discharge Orders     None         Faith Rogue, DO 07/24/23 1819    Melene Plan, DO 07/24/23 2027

## 2023-07-24 NOTE — Discharge Instructions (Signed)
You came to the emergency department for shortness of breath.  Chest x-ray and lab work do not show any emergent cause of your shortness of breath.  Shortness of breath may be due to the COVID that you had a week ago or deconditioning from resting while you were feeling ill.  Please return to the emergency department if you develop worsening shortness of breath, chest pain, leg swelling, or if the shortness of breath is not improving.  Please follow-up with PCP within 1 to 2 weeks.
